# Patient Record
Sex: Female | Born: 1945
Health system: Southern US, Community
[De-identification: ages and names within clinical notes are randomized; demographics above are authoritative.]

## PROBLEM LIST (undated history)

## (undated) ENCOUNTER — Emergency Department (HOSPITAL_COMMUNITY): Admission: EM

## (undated) DIAGNOSIS — R911 Solitary pulmonary nodule: Secondary | ICD-10-CM

## (undated) DIAGNOSIS — K219 Gastro-esophageal reflux disease without esophagitis: Secondary | ICD-10-CM

## (undated) DIAGNOSIS — Z923 Personal history of irradiation: Secondary | ICD-10-CM

## (undated) DIAGNOSIS — C679 Malignant neoplasm of bladder, unspecified: Secondary | ICD-10-CM

## (undated) DIAGNOSIS — Z720 Tobacco use: Secondary | ICD-10-CM

## (undated) HISTORY — DX: Tobacco use: Z72.0

## (undated) HISTORY — DX: Malignant neoplasm of bladder, unspecified: C67.9

## (undated) HISTORY — DX: Solitary pulmonary nodule: R91.1

## (undated) HISTORY — DX: Gastro-esophageal reflux disease without esophagitis: K21.9

## (undated) HISTORY — PX: CYSTOSCOPY: SUR368

---

## 1997-08-13 ENCOUNTER — Ambulatory Visit (HOSPITAL_COMMUNITY): Admission: RE | Admit: 1997-08-13 | Discharge: 1997-08-13 | Payer: Self-pay | Admitting: Family Medicine

## 2001-05-24 ENCOUNTER — Inpatient Hospital Stay (HOSPITAL_COMMUNITY): Admission: EM | Admit: 2001-05-24 | Discharge: 2001-06-15 | Payer: Self-pay | Admitting: *Deleted

## 2001-05-24 ENCOUNTER — Encounter (INDEPENDENT_AMBULATORY_CARE_PROVIDER_SITE_OTHER): Payer: Self-pay | Admitting: *Deleted

## 2001-05-24 ENCOUNTER — Encounter: Payer: Self-pay | Admitting: *Deleted

## 2001-05-24 ENCOUNTER — Encounter (INDEPENDENT_AMBULATORY_CARE_PROVIDER_SITE_OTHER): Payer: Self-pay | Admitting: Specialist

## 2001-05-25 ENCOUNTER — Encounter: Payer: Self-pay | Admitting: Internal Medicine

## 2001-05-26 ENCOUNTER — Encounter: Payer: Self-pay | Admitting: Internal Medicine

## 2001-05-26 ENCOUNTER — Encounter: Payer: Self-pay | Admitting: Family Medicine

## 2001-05-27 ENCOUNTER — Encounter: Payer: Self-pay | Admitting: Internal Medicine

## 2001-05-27 ENCOUNTER — Encounter: Payer: Self-pay | Admitting: Surgery

## 2001-05-28 ENCOUNTER — Encounter: Payer: Self-pay | Admitting: Critical Care Medicine

## 2001-05-29 ENCOUNTER — Encounter: Payer: Self-pay | Admitting: Internal Medicine

## 2001-05-30 ENCOUNTER — Encounter: Payer: Self-pay | Admitting: Surgery

## 2001-05-30 ENCOUNTER — Encounter: Payer: Self-pay | Admitting: Internal Medicine

## 2001-06-01 ENCOUNTER — Encounter: Payer: Self-pay | Admitting: Pulmonary Disease

## 2001-06-07 ENCOUNTER — Encounter: Payer: Self-pay | Admitting: Internal Medicine

## 2001-06-11 ENCOUNTER — Encounter: Payer: Self-pay | Admitting: Internal Medicine

## 2001-06-20 ENCOUNTER — Encounter (HOSPITAL_COMMUNITY): Admission: RE | Admit: 2001-06-20 | Discharge: 2001-09-18 | Payer: Self-pay | Admitting: Dentistry

## 2001-07-31 ENCOUNTER — Encounter: Admission: RE | Admit: 2001-07-31 | Discharge: 2001-10-08 | Payer: Self-pay | Admitting: Surgery

## 2001-09-04 ENCOUNTER — Ambulatory Visit (HOSPITAL_COMMUNITY): Admission: RE | Admit: 2001-09-04 | Discharge: 2001-09-04 | Payer: Self-pay | Admitting: Family Medicine

## 2001-09-04 ENCOUNTER — Encounter: Payer: Self-pay | Admitting: Family Medicine

## 2001-09-26 ENCOUNTER — Encounter: Admission: RE | Admit: 2001-09-26 | Discharge: 2001-09-26 | Payer: Self-pay | Admitting: Infectious Diseases

## 2001-12-13 ENCOUNTER — Ambulatory Visit (HOSPITAL_COMMUNITY): Admission: RE | Admit: 2001-12-13 | Discharge: 2001-12-13 | Payer: Self-pay | Admitting: Family Medicine

## 2002-01-14 ENCOUNTER — Encounter: Admission: RE | Admit: 2002-01-14 | Discharge: 2002-01-14 | Payer: Self-pay | Admitting: Family Medicine

## 2002-01-14 ENCOUNTER — Encounter: Payer: Self-pay | Admitting: Family Medicine

## 2005-03-30 ENCOUNTER — Inpatient Hospital Stay (HOSPITAL_COMMUNITY): Admission: EM | Admit: 2005-03-30 | Discharge: 2005-04-07 | Payer: Self-pay | Admitting: Emergency Medicine

## 2005-04-22 ENCOUNTER — Ambulatory Visit: Payer: Self-pay | Admitting: *Deleted

## 2005-04-22 ENCOUNTER — Ambulatory Visit: Payer: Self-pay | Admitting: Family Medicine

## 2005-05-01 ENCOUNTER — Encounter (INDEPENDENT_AMBULATORY_CARE_PROVIDER_SITE_OTHER): Payer: Self-pay | Admitting: Family Medicine

## 2005-05-19 ENCOUNTER — Ambulatory Visit: Payer: Self-pay | Admitting: Family Medicine

## 2005-07-04 ENCOUNTER — Encounter: Admission: RE | Admit: 2005-07-04 | Discharge: 2005-07-04 | Payer: Self-pay | Admitting: Family Medicine

## 2005-11-16 ENCOUNTER — Ambulatory Visit: Payer: Self-pay | Admitting: Family Medicine

## 2006-07-20 ENCOUNTER — Ambulatory Visit (HOSPITAL_COMMUNITY): Admission: RE | Admit: 2006-07-20 | Discharge: 2006-07-20 | Payer: Self-pay | Admitting: Family Medicine

## 2006-10-13 ENCOUNTER — Encounter (INDEPENDENT_AMBULATORY_CARE_PROVIDER_SITE_OTHER): Payer: Self-pay | Admitting: Family Medicine

## 2006-11-27 ENCOUNTER — Ambulatory Visit: Payer: Self-pay | Admitting: Family Medicine

## 2006-12-19 DIAGNOSIS — J449 Chronic obstructive pulmonary disease, unspecified: Secondary | ICD-10-CM | POA: Insufficient documentation

## 2006-12-19 DIAGNOSIS — K5289 Other specified noninfective gastroenteritis and colitis: Secondary | ICD-10-CM | POA: Insufficient documentation

## 2006-12-19 DIAGNOSIS — J441 Chronic obstructive pulmonary disease with (acute) exacerbation: Secondary | ICD-10-CM | POA: Insufficient documentation

## 2006-12-19 DIAGNOSIS — F1021 Alcohol dependence, in remission: Secondary | ICD-10-CM

## 2006-12-19 DIAGNOSIS — N390 Urinary tract infection, site not specified: Secondary | ICD-10-CM | POA: Insufficient documentation

## 2006-12-19 DIAGNOSIS — D700 Congenital agranulocytosis: Secondary | ICD-10-CM

## 2007-08-01 ENCOUNTER — Ambulatory Visit (HOSPITAL_COMMUNITY): Admission: RE | Admit: 2007-08-01 | Discharge: 2007-08-01 | Payer: Self-pay | Admitting: Family Medicine

## 2007-09-12 ENCOUNTER — Ambulatory Visit: Payer: Self-pay | Admitting: Family Medicine

## 2007-09-12 DIAGNOSIS — R059 Cough, unspecified: Secondary | ICD-10-CM | POA: Insufficient documentation

## 2007-09-12 DIAGNOSIS — R05 Cough: Secondary | ICD-10-CM

## 2007-09-13 ENCOUNTER — Ambulatory Visit (HOSPITAL_COMMUNITY): Admission: RE | Admit: 2007-09-13 | Discharge: 2007-09-13 | Payer: Self-pay | Admitting: Family Medicine

## 2007-10-21 ENCOUNTER — Telehealth (INDEPENDENT_AMBULATORY_CARE_PROVIDER_SITE_OTHER): Payer: Self-pay | Admitting: *Deleted

## 2007-10-21 LAB — CONVERTED CEMR LAB
AST: 48 units/L — ABNORMAL HIGH (ref 0–37)
Albumin: 4 g/dL (ref 3.5–5.2)
Alkaline Phosphatase: 72 units/L (ref 39–117)
BUN: 5 mg/dL — ABNORMAL LOW (ref 6–23)
CO2: 22 meq/L (ref 19–32)
Calcium: 9 mg/dL (ref 8.4–10.5)
Chloride: 103 meq/L (ref 96–112)
Eosinophils Relative: 2 % (ref 0–5)
Glucose, Bld: 114 mg/dL — ABNORMAL HIGH (ref 70–99)
HCT: 42.4 % (ref 36.0–46.0)
LDL Cholesterol: 36 mg/dL (ref 0–99)
Monocytes Absolute: 0.3 10*3/uL (ref 0.1–1.0)
Neutro Abs: 0.6 10*3/uL — ABNORMAL LOW (ref 1.7–7.7)
Platelets: 109 10*3/uL — ABNORMAL LOW (ref 150–400)
Potassium: 3.7 meq/L (ref 3.5–5.3)
Sodium: 139 meq/L (ref 135–145)
TSH: 2.803 microintl units/mL (ref 0.350–4.50)
Total CHOL/HDL Ratio: 1.7
Triglycerides: 80 mg/dL (ref <150)

## 2007-11-13 ENCOUNTER — Ambulatory Visit: Payer: Self-pay | Admitting: Family Medicine

## 2007-11-13 ENCOUNTER — Encounter (INDEPENDENT_AMBULATORY_CARE_PROVIDER_SITE_OTHER): Payer: Self-pay | Admitting: Family Medicine

## 2007-11-13 DIAGNOSIS — R310 Gross hematuria: Secondary | ICD-10-CM | POA: Insufficient documentation

## 2007-11-13 LAB — CONVERTED CEMR LAB
Glucose, Urine, Semiquant: NEGATIVE
WBC Urine, dipstick: NEGATIVE

## 2007-11-14 ENCOUNTER — Encounter (INDEPENDENT_AMBULATORY_CARE_PROVIDER_SITE_OTHER): Payer: Self-pay | Admitting: Family Medicine

## 2007-11-15 LAB — CONVERTED CEMR LAB
Basophils Absolute: 0 10*3/uL (ref 0.0–0.1)
Basophils Relative: 1 % (ref 0–1)
Eosinophils Absolute: 0.1 10*3/uL (ref 0.0–0.7)
Eosinophils Relative: 4 % (ref 0–5)
Folate: 18 ng/mL
HCT: 41.4 % (ref 36.0–46.0)
Hemoglobin: 13.3 g/dL (ref 12.0–15.0)
Hgb A1c MFr Bld: 5.2 % (ref 4.6–6.1)
Lymphocytes Relative: 68 % — ABNORMAL HIGH (ref 12–46)
Lymphs Abs: 1.9 10*3/uL (ref 0.7–4.0)
MCHC: 32.1 g/dL (ref 30.0–36.0)
MCV: 100.7 fL — ABNORMAL HIGH (ref 78.0–100.0)
Monocytes Absolute: 0.2 10*3/uL (ref 0.1–1.0)
Monocytes Relative: 9 % (ref 3–12)
Neutro Abs: 0.5 10*3/uL — ABNORMAL LOW (ref 1.7–7.7)
Neutrophils Relative %: 18 % — ABNORMAL LOW (ref 43–77)
Platelets: 144 10*3/uL — ABNORMAL LOW (ref 150–400)
RBC: 4.11 M/uL (ref 3.87–5.11)
RDW: 13.1 % (ref 11.5–15.5)
Vitamin B-12: 483 pg/mL (ref 211–911)
WBC: 2.8 10*3/uL — ABNORMAL LOW (ref 4.0–10.5)

## 2007-12-03 ENCOUNTER — Ambulatory Visit: Payer: Self-pay | Admitting: Family Medicine

## 2007-12-03 LAB — CONVERTED CEMR LAB
Bilirubin Urine: NEGATIVE
Ketones, urine, test strip: NEGATIVE
Specific Gravity, Urine: 1.015
pH: 5

## 2007-12-11 ENCOUNTER — Ambulatory Visit (HOSPITAL_COMMUNITY): Admission: RE | Admit: 2007-12-11 | Discharge: 2007-12-11 | Payer: Self-pay | Admitting: Family Medicine

## 2007-12-11 ENCOUNTER — Telehealth (INDEPENDENT_AMBULATORY_CARE_PROVIDER_SITE_OTHER): Payer: Self-pay | Admitting: Family Medicine

## 2007-12-11 DIAGNOSIS — C679 Malignant neoplasm of bladder, unspecified: Secondary | ICD-10-CM | POA: Insufficient documentation

## 2008-01-30 ENCOUNTER — Encounter (INDEPENDENT_AMBULATORY_CARE_PROVIDER_SITE_OTHER): Payer: Self-pay | Admitting: Family Medicine

## 2008-03-12 ENCOUNTER — Encounter (INDEPENDENT_AMBULATORY_CARE_PROVIDER_SITE_OTHER): Payer: Self-pay | Admitting: Family Medicine

## 2008-07-21 ENCOUNTER — Encounter (INDEPENDENT_AMBULATORY_CARE_PROVIDER_SITE_OTHER): Payer: Self-pay | Admitting: Family Medicine

## 2008-08-01 ENCOUNTER — Ambulatory Visit (HOSPITAL_COMMUNITY): Admission: RE | Admit: 2008-08-01 | Discharge: 2008-08-01 | Payer: Self-pay | Admitting: Family Medicine

## 2009-04-27 IMAGING — US US RENAL
1 series · 13 of 25 positions shown · non-contrast
Comparison: Renal ultrasound 04/02/2005

CLINICAL DATA: Patient with painless gross hematuria.

RENAL/URINARY TRACT ULTRASOUND
TECHNIQUE: Complete ultrasound examination of the urinary tract
was performed including evaluation of the kidneys, renal collecting
systems, and urinary bladder.

[Series 1: unknown · 0.30mm/px · 13 of 46 slices shown]
[im 1/46]
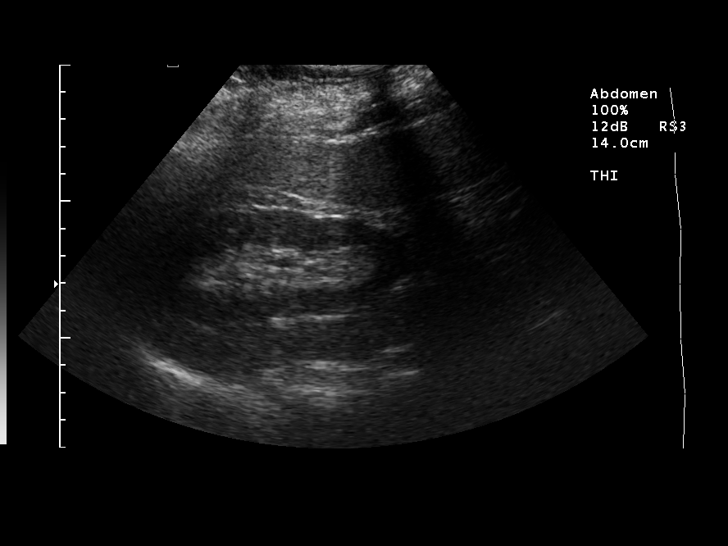
[im 4/46]
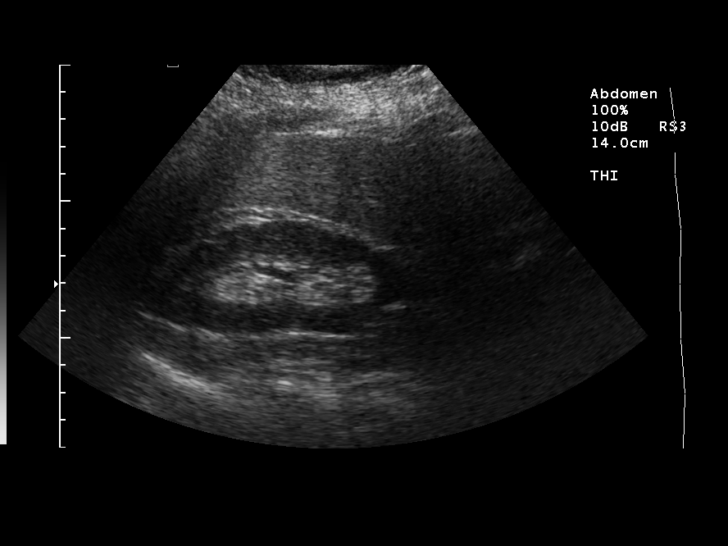
[im 8/46]
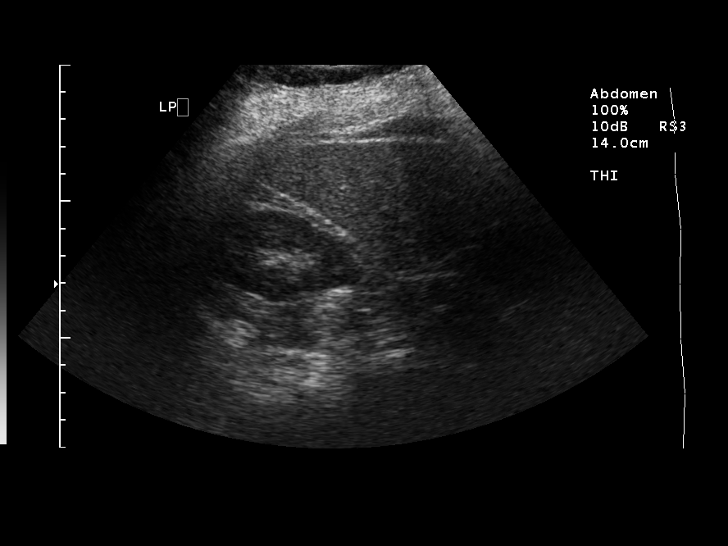
[im 12/46]
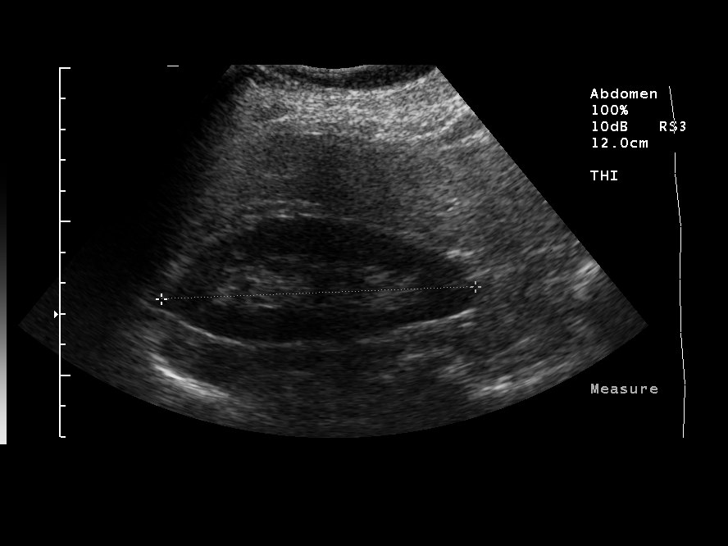
[im 16/46]
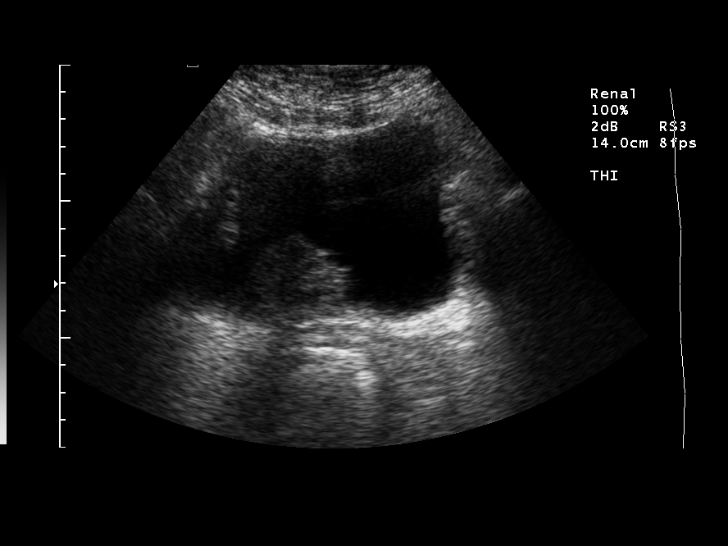
[im 19/46]
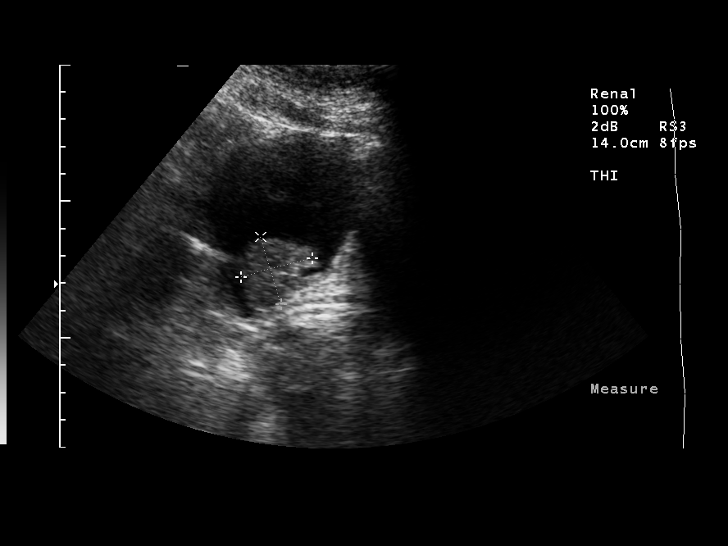
[im 23/46]
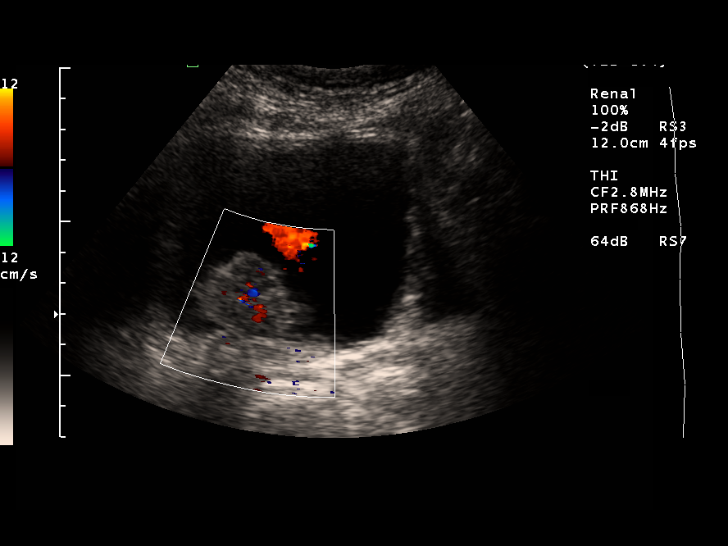
[im 27/46]
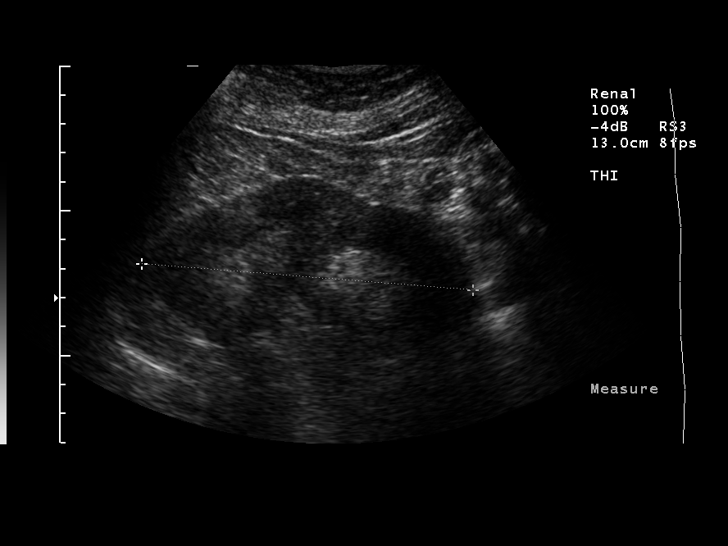
[im 31/46]
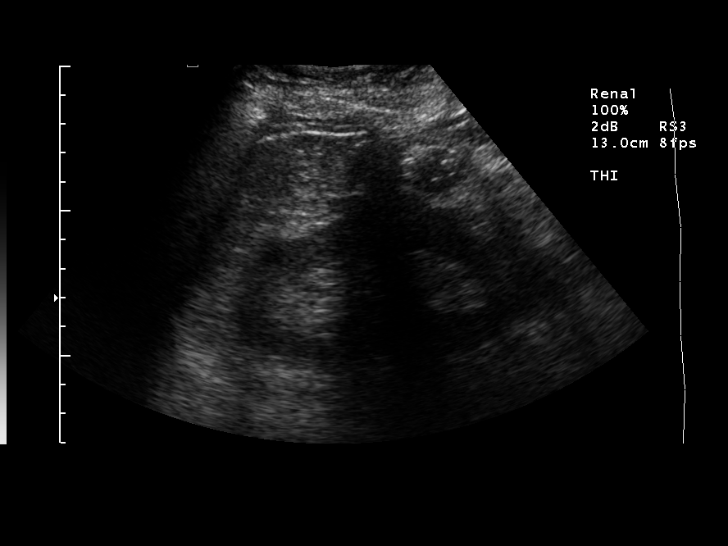
[im 34/46]
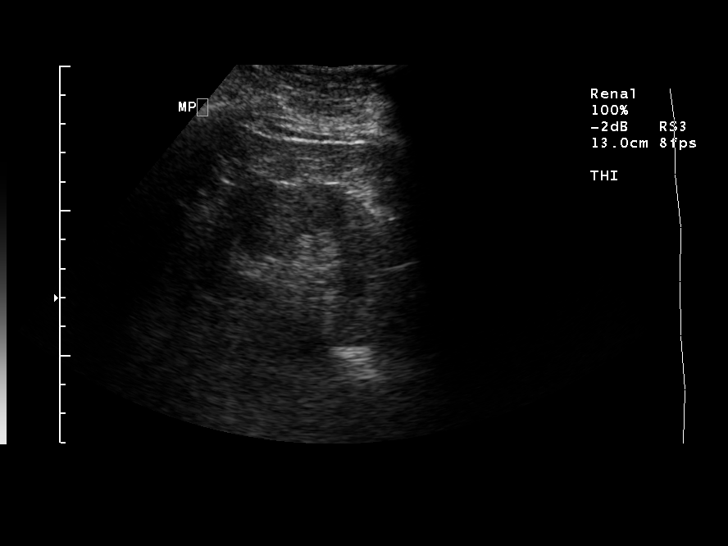
[im 38/46]
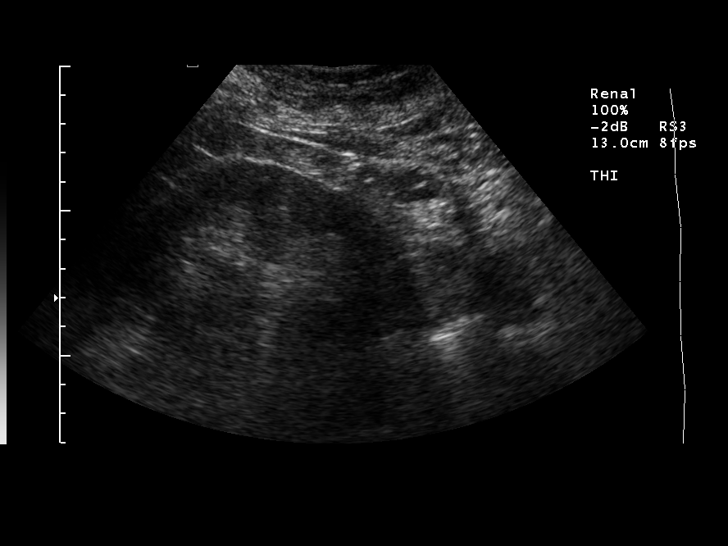
[im 42/46]
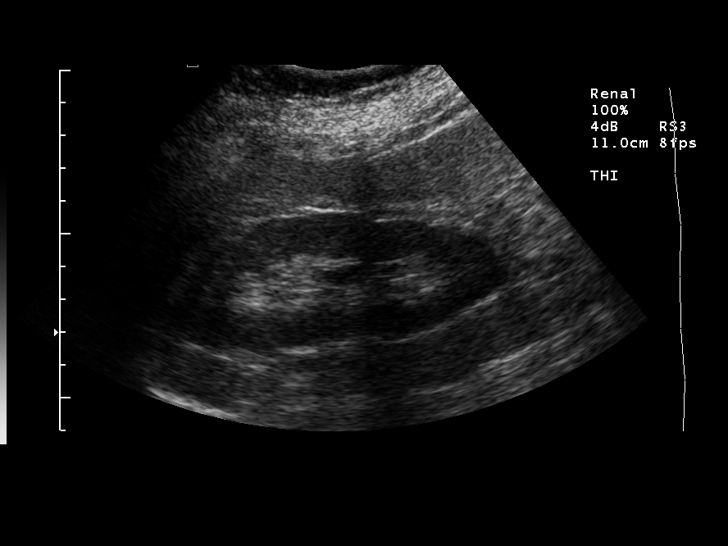
[im 46/46]
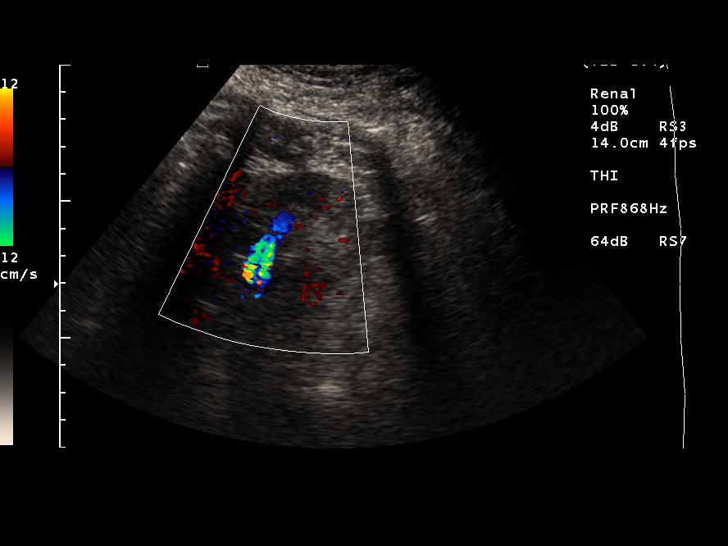

[13 of 25 positions shown; findings below may reference images not displayed]

FINDINGS: The kidneys are normal in size, measuring 11.2 cm in
length on the right and 11.5 cm in length on the left.
Echogenicity of the right kidney is decreased relative to the
adjacent liver, raising the question of fatty infiltration of the
liver.  Renal parenchyma appears preserved bilaterally.  There is
no hydronephrosis.

The urinary bladder is moderately distended with urine.  There is a
polypoid echogenic and slightly heterogeneous mass associated with
the posterior urinary bladder wall, at the level of midline and
extending to the right of midline.  This mass measures 2.7 x 2.6 x
4.1 cm.  Color Doppler imaging evaluation demonstrates vascular
flow within the mass.  There is no evidence of ureteral dilatation
at the bladder base.
IMPRESSION: 1. Polypoid soft tissue mass associated with the posterior urinary
bladder wall contains internal vascular flow and is suspicious for
transitional cell carcinoma.  Blood clot can have a similar
appearance, but we would not expect to see internal vascular flow
within clot.  Further evaluation with cystoscopy is suggested.
Findings were discussed with Dr. Rithvik by telephone on 12/11/2007
at [DATE].

2. Question fatty infiltration of the liver.

## 2009-08-05 ENCOUNTER — Ambulatory Visit (HOSPITAL_COMMUNITY): Admission: RE | Admit: 2009-08-05 | Discharge: 2009-08-05 | Payer: Self-pay | Admitting: Internal Medicine

## 2009-08-11 ENCOUNTER — Telehealth (INDEPENDENT_AMBULATORY_CARE_PROVIDER_SITE_OTHER): Payer: Self-pay | Admitting: Internal Medicine

## 2010-02-21 ENCOUNTER — Encounter: Payer: Self-pay | Admitting: Occupational Therapy

## 2010-03-04 NOTE — Progress Notes (Signed)
Summary: SPOKE W/MS [place]  Phone Note Outgoing Call   Summary of Call: This pt. has not been seen in about 1 1/2 years--would be good for her to establish with a provider now present. Initial call taken by: Julieanne Manson MD,  August 11, 2009 9:56 AM  Follow-up for Phone Call        TALK W/MS [place] AND SHE HAS BEEN GOING TO [place] FOR 2 YRS FOR HER CANCER AND SHE SAYS THAT SHE ISN'T TAKING ANY MEDS AND THE DR'S THERE HAVE BEEN CHECKING HER OUT WITH HER BP, AND SHE SAYS THAT IF SHE NEEDS TO MAKE AN APPOINTMENT IN THE FUTURE SHE WILL GIVE [place] A CALL. Follow-up by: Leodis Rains,  August 13, 2009 10:05 AM

## 2010-06-18 NOTE — Op Note (Signed)
[place]. [facility]  Patient:    [name], [name] Visit Number: [id number] MRN: [id number]          Service Type: MED Location: [phone] Attending Physician:  Edwyna Perfect Dictated by:   Sandria Bales. Ezzard Standing, M.D. Proc. Date: 06/04/01 Admit Date:  05/24/2001 Discharge Date: 05/25/2001   CC:         Dr. Alfonse Alpers, Anesthesia Svc  C. Ulyess Mort, M.D.   Operative Report  PREOPERATIVE DIAGNOSIS:  Right calf abscess.  POSTOPERATIVE DIAGNOSIS:  Right calf abscess.  OPERATION PERFORMED:  Incision and drainage of right calf abscess with packing of 2-inch iodoform gauze.  SURGEON:  Sandria Bales. Ezzard Standing, M.D.  ANESTHESIA:  General endotracheal.  ESTIMATED BLOOD LOSS:  100 cc.  DRAINS:  None.  INDICATIONS FOR PROCEDURE:  Ms. [name] is a 65 year old black female who was admitted to the hospital on May 24, 2001 for questionable pneumonia but she developed this kind of desquamating skin condition of her lower extremities, buttocks and a CT scan done yesterday questions a 7 x 4 x 3 cm abscess in the right calf.  There was nothing on the actual physical exam to indicate an abscess other than some induration.  She does have this desquamation which involves some skin of her lower calf but this actually appears to be healing. She is spiking temps to 105 and this is questionable the source of infection. Plan to bring the patient to the operating room for exploration.  DESCRIPTION OF PROCEDURE:  The patient placed in the operating room in the left lateral decubitus position and using an ultrasound probe 7.5 mhz ultrasound probe, I thought I could see a collection about midcalf.  I put a needle in this area and got some fluid out that I sent for culture and sensitivity.  I then made an incision which was about 3 to 4 cm in length over this area, got into a subfascial collection which I cleared out, irrigated and it looked like it had some kind of thin purulent  material but no real discrete walled abscess like what appeared to be on the CT scan.  I then packed it with 2-inch iodoform gauze, wrapped the leg with 4 x 4s and Kling.  She was transferred to recovery room in good condition.  Will resume her preoperative medicines and antibiotics and will follow this wound. Dictated by:   Sandria Bales. Ezzard Standing, M.D. Attending Physician:  Edwyna Perfect DD:  06/04/01 TD:  06/06/01 Job: [id number]

## 2010-06-18 NOTE — Op Note (Signed)
[place]. [facility]  Patient:    [name], [name] Visit Number: [id number] MRN: [id number]          Service Type: MED Location: [phone] Attending Physician:  Edwyna Perfect Dictated by:   Cindra Eves, D.D.S. Proc. Date: 06/14/01 Admit Date:  05/24/2001 Discharge Date: 06/15/2001   CC:         Lennis P. Darrold Span, M.D.  Gary Fleet, M.D.  Dante Gang, M.D.  Dewayne Shorter, M.D.   Operative Report  DATE OF BIRTH:  12/22/1945  PREOPERATIVE DIAGNOSES: 1. Group A Streptococcal septicemia. 2. Chronic obstructive pulmonary disease. 3. History of pneumonia. 4. Chronic periodontitis. 5. Apical periodontitis. 6. Dental caries. 7. Multiple root segments. 8. Bilateral maxillary tuberosities which hung down into the ideal occlusal    scheme.  POSTOPERATIVE DIAGNOSES: 1. Group A Streptococcal septicemia. 2. Chronic obstructive pulmonary disease. 3. History of pneumonia. 4. Chronic periodontitis. 5. Apical periodontitis. 6. Dental caries. 7. Multiple root segments. 8. Bilateral maxillary tuberosities which hung down into the ideal occlusal    scheme.  PROCEDURES: 1. Dental examination. 2. Extraction of remaining teeth #21, #22, #23, #24, #25, #25, and #27. 3. Two quadrants of alveoloplasty. 4. Bilateral maxillary tuberosity reductions of the upper right and upper left    quadrants.  SURGEON:  Cindra Eves, D.D.S.  ASSISTANT:  Elliot Dally (Sales executive).  ANESTHESIA:  General anesthesia via nasal endotracheal tube.  MEDICATIONS: 1. Clindamycin 600 mg IV at the start of the dental medicine procedure. 2. Local anesthesia with a total utilization of four carpules each containing    36 mg of Xylocaine with 0.018 mg of epinephrine.  SPECIMENS:  There were seven teeth which were discarded.  DRAINS/CULTURES:  None.  ESTIMATED BLOOD LOSS:  Less than 50 ml.  FLUIDS:  800 ml of lactated Ringers  solution.  COMPLICATIONS:  None.  INDICATIONS FOR PROCEDURE:  The patient had a history of pneumonia, group A Streptococcal septicemia, and COPD.  The patient was referred for evaluation to rule out dental infection which could effect the patients systemic health.  The patient was then evaluated and treatment planned for extraction of her remaining teeth with alveoloplasty as well as reduction of the maxillary tuberosities which were hanging down into the ideal occlusal scheme.  This treatment plan was formulated to decrease the risks and complications associated with dental infection effecting the patients systemic health.  OPERATIVE FINDINGS:  The patient was examined in operating room #3.  The patients teeth were identified for extraction and the maxillary tuberosities were identified for reduction.  The patient was noted to be effected by chronic periodontal disease, chronic apical periodontitis, presence of multiple root segments, and significant tooth mobility.  The preceding necessitated the removal of the remaining dentition to reduce the infection of dental disease effecting the patients systemic health.  The maxillary tuberosities were reduced to assist in the fabrication of future dental prosthesis (complete denture).  DESCRIPTION OF PROCEDURE:  The patient was brought to operating room #3.  The patient was then placed in the supine position on the operating room table. The patient was then intubated utilizing a nasal endotracheal tube.  The patient was then prepped and draped in the usual sterile manner for an oral surgical procedure.  The oral cavity was then thoroughly examined with the findings as noted above.  A throat pack was placed at this time.  The patient was then ready for the oral surgical procedure as follows:  Local anesthesia was administered with the total utilization of four carpules each containing 36 mg of Xylocaine with 0.018 mg of epinephrine during  the dental medicine procedure.  The anesthesia was administered sequentially over this procedure.  The mandibular quadrants were first approached.  Anesthesia was delivered as previously described.  A 15 blade incision was made from the distal of #19 through the mesial of #30.  A surgical flap was then reflected.  The remaining teeth and root segments were subluxated with a series of straight elevators.  The remaining teeth and root segments were then removed with a 151 forceps without complications.  This included teeth #21, #22, #23, #24, #25, #26, and #27.  Significant alveoloplasty was then performed utilizing rongeurs and bone file.  The surgical site was then irrigated with copious amounts of sterile saline.  The tissues were then approximated and trimmed appropriately.  The surgical site was then closed from the distal of #19 through the mesial of #24 utilizing 3-0 chromic gut suture material in a continuous interrupted suture technique x1.  The surgical site on the lower right was then closed utilizing 3-0 chromic gut suture material from the mesial of #30 through the mesial of #25 utilizing a continuous interrupted technique x1.  One additional interrupted suture was utilized to close the lower right quadrant.  The maxillary quadrants were then approached.  Anesthesia was delivered as previously described.  The maxillary left quadrant was then first approached. A 15 blade incision was made from the distal of the tuberosity through the mesial of #11.  The tuberosity reduction was achieved utilizing a 15 blade incision and the removal of the significant hyperplastic tissues.  The tissues were then approximated and trimmed appropriately.  The surgical site was then  irrigated with copious amounts of sterile saline.  The surgical site was then closed utilizing 3-0 chromic gut suture material in a continuous interrupted suture technique from the distal of the tuberosity through the  mesial of #11.  The maxillary right quadrant was then approached.  Anesthesia had been previously delivered.  A 15 blade incision was made from the distal tuberosity through the mesial of #6.  Significant amounts of a hyperplastic tissue were then removed utilizing the 15 blade and soft tissue pickups.  Further tissue recontouring was achieved utilizing soft tissue scissors.  The tissues were then approximated and trimmed appropriately.  The surgical site was then irrigated with copious amounts of sterile saline.  The surgical site was then closed utilizing 3-0 chromic gut suture material in a continuous interrupted suture technique from the distal of the tuberosity through the mesial of #6.  The entire mouth was then irrigated with copious amounts of sterile saline. The patient was examined for complications, seeing none, the dental medicine procedure was deemed to be complete.  The throat pack was removed at this time.  Two sets of 4 x 4 gauzes were placed in the mouth upon direction of the anesthesia team to assist in hemostasis.  The patient was then handed over to the anesthesia team for final disposition. After an appropriate amount of time, the patient was extubated, and taken to the postanesthesia care unit with stable vital signs and oxygenation level. All counts were correct for this dental medicine procedure. Dictated by:   Cindra Eves, D.D.S. Attending Physician:  Edwyna Perfect DD:  06/14/01 TD:  06/18/01 Job: [id number]

## 2010-06-18 NOTE — Discharge Summary (Signed)
[name], [name]                           ACCOUNT NO.:  [id number]   MEDICAL RECORD NO.:  [id number]                  PATIENT TYPE:   LOCATION:                                       FACILITY:   PHYSICIAN:  Dante Gang, M.D.               DATE OF BIRTH:  04-20-1945   DATE OF ADMISSION:  05/24/2001  DATE OF DISCHARGE:  06/15/2001                                 DISCHARGE SUMMARY   PROBLEM LIST:  1. Cavitary pneumonia.  2. History of neutropenia.  3. Group A Strep bacteremia and myositis.  4. History of alcohol abuse.  5. History of hysterectomy in 1981 at [facility] for tumor removal.   DISCHARGE MEDICATION:  Vicodin 5/500 one p.o. q.4h. p.r.n. pain.   HISTORY OF PRESENT ILLNESS:  The patient was a 65 year old woman who  presented to the emergency department with a five-day history of nausea,  vomiting and diarrhea, ear ache, headache, and cough productive of blood  tinged sputum.  She had also had fatigue, fever, chills and myalgias.  Up  until then she was in a state of good health and had very few previous  medical problems.  Her appetite was decreasing and she was unable to keep  anything down. She reported multiple sick contacts at work, many of which  were among recent immigrants.  She believed herself to be HIV negative.   ADMISSION PHYSICAL EXAMINATION:  Temperature 102.9, respiratory rate 24,  heart rate 106, blood pressure 92/63.  Oxygen saturation 99% on room air.  Her pupils were constricted.  She had a scab on her left foot, otherwise her  examination was entirely within normal limits at that time.   ADMISSION LABORATORY DATA:  White blood cell count 1.5, hemoglobin 13.2,  platelets 132.  Blood gas with pH of 7.51, pCO2 31, bicarb 25, breathing 96%  on room air.   Chest x-ray showed a cavitary lesion in the right upper lobe.   HOSPITAL COURSE:  PROBLEM #1 - CAVITARY LESION ON CHEST X-RAY:  This was  thought to be a possible necrotizing infection  due to its rapid onset or  possibly Klebsiella or an anaerobic species.  She was admitted to the  hospital and started on Zosyn and Tequin.  She was monitored in respiratory  isolation and PPD was checked.   Infectious disease was consulted and recommended stopping the Tequin and  continuing the Zosyn.   Around May 26, 2001, the patient began to develop severe sepsis with  positive blood cultures for Strep pyogenes.  Critical care medicine was  consulted and she was monitored in intensive care.   She also developed bilateral lower extremity edematous processes suggesting  underlying rhabdo and fasciitis.  Surgery was consulted on May 27, 2001,  and they did not see a role at that time for fasciotomy.  After review of  MRI of the left leg, a bullous lesion was  found by surgery and I&D was  performed and left open for drainage.  She was placed on Primaxin due to the  group A bacteremia.  PPD was negative.   Her blood pressure began to stabilize and her antibiotics were switched to  Rocephin and clindamycin on  May 29, 2001.   As of May 1, she had had three negative AFB smears and was transferred to a  regular bed.  Bronchoscopy was performed on Jun 01, 2001.  On review with the  pathologist of the cultures, it appeared that the cavitary lesion was likely  due to group A strep.  She developed a drug fever while on the Rocephin and  clindamycin and was taken off this and placed on Tequin after which the  fever and rash resolved.   The patient's fever began to trend down rapidly and the chest x-ray showed  her cavitary lung lesion to be improving considerably.  She began improving  clinically and was still having occasional temperatures to 105 to 106 but on  the whole, her condition was thought to be improved well enough for  discharge.  Prior to discharge, she was taken off all antibiotics and the  plan was to observe her off these antibiotics for a few weeks.  She was to  follow  up with Dr. Alfonse Alpers in the [facility] and afterwards, to return  to [facility]   PROBLEM #2 - NEUTROPENIA:  The patient was seen in the hospital by Dr.  Darrold Span.  She felt that a bone marrow biopsy was unnecessary because the  neutropenia was almost certainly secondary to long-term alcohol abuse.  She  was placed on Neupogen during her hospitalization and her white blood cell  count rose as high as 7.0 before discharge.   PROBLEM #3 - POOR DENTITION:  The patient was seen by Dr. Kristin Bruins in house  who extracted her remaining teeth.  She was to follow up with him one week  after discharge.                                               Dante Gang, M.D.    JP/MEDQ  D:  12/18/2001  T:  12/18/2001  Job:  [id number]

## 2010-06-18 NOTE — Consult Note (Signed)
[place]. [facility]  Patient:    [name], [name] Visit Number: [id number] MRN: [id number]          Service Type: MED Location: MICU [id number] Attending Physician:  Farley Ly Dictated by:   Lorette Ang, N.P. Proc. Date: 05/25/01 Admit Date:  05/24/2001 Discharge Date: 05/25/2001   CC:         Farley Ly, M.D.   Consultation Report  DATE OF BIRTH:  [birth year]  REASON FOR CONSULTATION:  Neutropenia.  REFERRING PHYSICIAN:  Farley Ly, M.D.  HISTORY OF PRESENT ILLNESS:  Mr. [name] is a 65 year old woman with a history of neutropenia documented back to 1999 at which time her ANC was 0.8 who was admitted on May 24, 2001 with a febrile illness and was found to have a cavitary lesion within the right upper lobe.  Follow-up chest CT confirmed a 5 cm complex cystic lesion in the right upper lobe suspicious for TB and/or fungal disease.  Admission laboratories showed hemoglobin 13.2, MCV 87, white blood cell count 1.5, ANC 0.9, ALC 0.3, platelet count 132,000, sodium 130, potassium 2.9, creatinine 1.0, total bilirubin 0.8, alkaline phosphatase 33, SGOT 87, SGPT 29, total protein 5.9, albumin 2.1, calcium 7.1.  Other laboratories include B12 883, ferritin 1560, iron less than 10, folate RBC 288, normal TSH.  Four blood cultures all with gram-positive cocci.  A review of prior laboratory work shows hemoglobin 13.3, MCV 96, white blood cell count 2.8, ANC 0.8, platelet count 197,000 July 1999.  Hemoglobin 14, MCV 95, white blood cell count 3.1, ANC 0.5, platelet count 216,000 September 1999.  Hematology consult was requested for further evaluation of this patient with a history of neutropenia.  PAST MEDICAL HISTORY: 1. Questionable history of COPD. 2. History of neutropenia July 1999. 3. Status post hysterectomy secondary to fibroid tumor 1981.  MEDICATIONS: 1. Folic acid 1 mg daily. 2. Tequin 400 mg daily. 3. Zosyn  3.375 g q.6h. 4. Thiamine 100 mg daily. 5. Vancomycin q.24h.  ALLERGIES:  No known drug allergies.  FAMILY HISTORY:  Mother is living and reported to be healthy.  Father deceased with throat cancer.  Two sisters and one brother reported to be healthy. Paternal uncle deceased with "cancer."  Patient also reports possible leukemia in niece and nephew of her mother.  SOCIAL HISTORY:  Ms. [name] lives in [place] with her daughter.  She is single.  She is currently employed at [employer].  She reports tobacco use at one and a half packs per day for approximately 44 years up until 10 days prior to admission.  She reports ETOH intake at one to two quarts of beer per day.  She has used marijuana in the past.  She denies any IV drug use.  She has never had a blood transfusion.  REVIEW OF SYSTEMS:  Patient denies any weight loss or anorexia.  She has been having fevers.  She reports increasing weakness.  She states her muscles have been achy.  She has also been experiencing headaches.  She denies any vision changes.  She does report some right ear pain.  She denies any mouth sores. She has not noticed any enlarged lymph nodes.  She has been experiencing dyspnea on exertion.  She has had a cough with hemoptysis at times over the past five days.  She denies any chest pain or peripheral edema.  She has been having some diarrhea.  She has had no bright red  blood per rectum.  She does report possible melena prior to admission.  She denies any hematuria or dysuria.  She reports pain with sexual intercourse.  PHYSICAL EXAMINATION  VITAL SIGNS:  Temperature 100.7, heart rate 116, respirations 34, blood pressure 92/46, oxygen saturation 94% on 3 L.  GENERAL:  Pleasant African-American female lying in bed in no distress at present.  HEENT:  Normocephalic, atraumatic.  Pupils are equal, round, and reactive to light.  Extraocular movements are intact.  Sclerae anicteric.  Tongue is  dry. There is a white patch on the left posterior palate.  Poor dentition.  LYMPH:  No palpable cervical, supraclavicular, or axillary lymph nodes.  CHEST:  Scattered rhonchi.  CARDIOVASCULAR:  Regular rate and rhythm.  ABDOMEN:  Soft and nontender.  Bowel sounds are hypoactive.  EXTREMITIES:  No clubbing, cyanosis, edema.  She is diffusely tender over her lower extremities without clear cords.  There is a linear abrasion on her left shin.  PPD noted on right forearm, but no controls.  GENITOURINARY:  Foley with pinkish urine.  NEUROLOGIC:  Alert and oriented.  Speech somewhat difficult to understand.  SKIN:  Warm.  LABORATORY DATA:  Hemoglobin 12.2, white count 0.8, platelets 97,000.  Sodium 129, potassium 3.6, BUN 12, creatinine 1.2, glucose 118, calcium 7.2, folate RBC 288, B12 883, iron less than 10, ferritin 1560.  Prior laboratories July 1999:  Hemoglobin 13.3, white count 2.8, ANC 0.8, platelets 197,000. Laboratories September 1999:  Hemoglobin 14, white count 3.1, platelets 216,000, ANC 0.5.  Review of peripheral blood smear:  rbcs negative, platelets slightly decreased, no immature cells seen.  Urinalysis remarkable for large amount of blood with 3-10 rbcs per high powered field.  IMPRESSION/RECOMMENDATIONS: 1. Cavitary lung mass in smoker with gram-positive cocci in multiple blood    cultures.  Infectious disease work-up is in progress.  She may still need    bronchoscopy depending on other evaluation and results. 2. Neutropenia documented back to 1999.  Question if this is a benign    leukopenia in an African-American female versus marrow suppression from    chronic ETOH "a quart a day since I was 33" versus secondary to infection    versus other bone marrow etiology.  We doubt any primary marrow abnormality    such as leukemia with counts from 1999.  Suggest empirically beginning    Nupogen with apparent extensive infection with multiple positive blood     cultures.  3. Thrombocytopenia, mild.  Question if this is secondary to ETOH versus    infection.  There is no evidence of DIC by peripheral smear.  Only bleeding    is questionable hematuria.  We will continue to follow with you.  Patient seen and examined by Dr. Darrold Span.  Laboratories, peripheral blood smear, and CT scans reviewed. Dictated by:   Lorette Ang, N.P. Attending Physician:  Farley Ly DD:  05/29/01 TD:  05/30/01 Job: [id number]

## 2010-06-18 NOTE — Discharge Summary (Signed)
[name], [name]               ACCOUNT NO.:  [id number]   MEDICAL RECORD NO.:  [id number]          PATIENT TYPE:  INP   LOCATION:  5743                         FACILITY:  [facility]   PHYSICIAN:  Isidor Holts, M.D.  DATE OF BIRTH:  October 15, 1945   DATE OF ADMISSION:  03/30/2005  DATE OF DISCHARGE:  04/07/2005                                 DISCHARGE SUMMARY   DISCHARGE DIAGNOSES:  1.  Escherichia coli urinary tract infection/Urosepsis.  2.  Bicytopenia.  3.  Oral thrush.  4.  History of alcohol abuse.  5.  Dehydration/Electrolyte abnormalities.   DISCHARGE MEDICATIONS:  1.  Multivitamins one p.o. daily.  2.  Foltx one p.o. daily.   OPERATION/PROCEDURE:  1.  Chest x-ray dated March 30, 2005 which showed bronchial thickening      and hyperinflation, query chronic bronchitis, also residual scarring in      the right upper lobe.  No acute consolidation or definite pneumonia.  2.  Chest x-ray dated April 02, 2005. This showed stable test.  No acute      findings.  3.  Renal ultrasound scan dated April 02, 2005.  This showed no evidence of      hydronephrosis of focal renal mass.  4.  Portable chest x-ray dated April 03, 2005.  This showed no acute      cardiopulmonary disease.   CONSULTATIONS:  None.   ADMISSION HISTORY:  As in H&P notes of March 30, 2005 dictated by Dr.  Jamison Oka.  However, in brief, this is a 65 year old female, with past  medical history significant for hospitalization in 2003 for necrotizing  pneumonia and strep bacteremia, prior history of alcohol abuse, smoking  history.  Also neutropenia noted during hospitalization in 2003.  Presenting  on this occasion, with a five-day history of fever and chills.  There was no  history of dysuria, urgency, or urinary frequency.  She was admitted for  evaluation, investigation and management.   HOSPITAL COURSE:  1.  E. coli UTI/Urosepsis.  The patient presents with a temperature of      104.1, blood  pressure in the low 90s systolic, white cell count of 1.8      with a absolute neutrophil count of 32.  Although she had no urinary      symptoms at the time of presentation, urinalysis showed wbc's 10 to 15      and many bacteria.  She was, therefore, started with broad-spectrum      antibiotic coverage with Zosyn and ciprofloxacin. Septic work-up was      done, including blood cultures, chest x-ray, urinalysis.  Chest x-ray      was negative for acute disease.  Urine cultures subsequently      demonstrated E. coli, which was pansensitive, although it was resistant      to cephazolin.  Blood cultures remained persistently negative.  On April 02, 2005, the patient had a persistent low-grade fever of 100.8.  Zosyn      was, therefore, discontinued and the patient switched to a combination  of vancomycin and Primaxin.  Threafter, clinical response was steady.      As of April 04, 2005, the patient was no longer pyrexial, was looking      quite well, able to maintain oral intake without any problems      whatsoever, and blood cultures were again negative.  Vancomycin was      discontinued.  The patient was continued on Primaxin which was      eventually discontinued on April 05, 2005.  The patient has remained      afebrile.  Repeat urinalysis of April 05, 2005 showed wbc's 0-2, bacteria      rare, rbc's 21-50,  but the patient had a Foley catheter in situ.  It is      noted that renal ultrasound scan dated April 05, 2005 showed no evidence      of hydronephrosis or focal renal mass.   1.  Neutropenia.  This is deemed likely secondary to sepsis.  Review of      electronic medical records, indicated that the patient was found to be      neutropenic during last admission in February 2003.  At that time she      was briefly under the care of Dr. Darrold Span, hematologist.  Conclusion at      that time, was that her neutropenia was secondary to sepsis and alcohol      abuse.  She presents on this  occasion with a leukocyte count of 1.38 and      absolute neutrophils count of 32.  Neutropenic precautions were      instituted.  Platelet count was 118.  The HIV test was negative.  The      patient's white cell count and platelet count subsequently improved      during the course of her hospital stay.  We are pleased to announce that      on April 06, 2005 her white blood cell count was 2.6 with a platelet      count of 408.  Hemoglobin was 12.  The patient thus has a isolated      leukopenia.  I had a curb side discussion by telephone, with Dr.      Darrold Span, who contends that this patient has been seen by her so far back      in the past, that she will, for  follow up purposes, require referral to      the hematology clinic as a new referral for evaluation.  We shall defer      this to the patient's PMD.   1.  Oral thrush.  At the time of physical evaluation on admission, the      patient was found to have oral thrush.  She was treated with a seven-day      course of Diflucan and complete solution.   1.  History of alcohol abuse.  The patient carries a background history of      prior alcohol abuse.  However, on further questioning, it appears that      the patient drinks one beer per day at present. During the course of her      hospital stay, she was managed with vitamin supplementation.  There was      no evidence of alcohol withdrawal phenomena.   1.  Smoking.  The patient has been counseled appropriately for this.   1.  Dehydration/Electrolyte abnormalities.  At the time of initial  presentation, the patient had a hyponatremia, with sodium of 128.      Potassium was also reduced at 3.  BUN 5, creatinine 0.9.  Given the      patient's borderline low systolic blood pressure, it was felt that she      volume depleted.  She was managed with intravenous fluid hydration, with     recovery of sodium levels.  As a matter of fact serum electrolytes on      April 06, 2005 showed a  sodium of 136, potassium 4.3.  She did have a      transient episode of loose stools during the course of her hospital      stay, however, this was negative for C. diff toxin.  She was managed      briefly with probiotics, with complete resolution of symptoms.   DISPOSITION:  It is anticipated that the patient is stable enough for  discharge on April 07, 2005, provided no acute issues arise.  She may return  to regular activities on April 11, 2005.   DIET:  No restrictions.   ACTIVITY:  As tolerated.   WOUND CARE:  Not applicable.   PAIN MANAGEMENT:  Not applicable.   FOLLOW UP:  The patient is to follow up with her primary M.D., Dr. Luciana Axe,  [facility] in one to two weeks.  She has been instructed to call for an  appointment.  We expect her PMD, Dr. Luciana Axe, to follow up on the patient's  CBC and if patient is still persistently neutropenic, arrange referral to  hematology at his own discretion.      Isidor Holts, M.D.  Electronically Signed     CO/MEDQ  D:  04/06/2005  T:  04/07/2005  Job:  [id number]   cc:   Fanny Dance. Rankins, M.D.  Fax: [phone]

## 2010-06-18 NOTE — Consult Note (Signed)
[facility]  Patient:    [name], [name] Visit Number: [id number] MRN: [id number]          Service Type: MED Location: MICU 2108 01 Attending Physician:  Farley Ly Dictated by:   Carlisle Beers. Dorothyann Gibbs, M.D. Admit Date:  05/24/2001 Discharge Date: 05/25/2001                            Consultation Report  CHIEF COMPLAINT:  Sepsis with bulla, left calf.  HISTORY OF PRESENT ILLNESS:  This 65 year old black female was admitted with what has turned out to be group A septicemia.  She has been also treated for TB.  She has history of alcohol abuse, tobacco abuse, and chronic poor health. She has been found to be group A Strep septicemic and in the last two days developed a large bulla, left calf.  Concern is muscle involvement.  Patient information sheet and medical history are extensively reviewed.  PHYSICAL EXAMINATION  GENERAL:  Slender black female in obvious discomfort.  EXTREMITIES:  Left calf discolored and swollen.  In the medial gastrocnemius area there is about a 3 x 1.5 inch bulla ballooning about 1 inch from the normal tissue.  The calf muscle underlying does appear to be sore.  She has good pulses in the foot.  In the ______ leg now there is a 1 x 2 cm bulla that is superficial.  There appears to be edema medially superficial to the muscle and this was not present a number of hours ago according to the ICU nurse.  IMPRESSION:  Strep cellulitis bilateral calf involvement with skin bullae and probable muscle involvement left leg.  The right calf is rapidly changing.  RECOMMENDATIONS:  I personally have no experience with necrotizing fasciitis. I have spoken to Dr. Maurice March and he says that the general surgeons have generally cared for these.  I would defer to Dr. Daphine Deutscher of general surgery but my own inclination would be to I&D the left calf or at least get an MRI of the calf to evaluate the muscle if an I&D is not done.  The right  calf cellulitis is apparently rapidly developing and is cause for concern and may need I&D there also depending on severity of findings in the other leg.  I would be happy to discuss this with Dr. Daphine Deutscher. Dictated by:   Carlisle Beers. Dorothyann Gibbs, M.D. Attending Physician:  Farley Ly DD:  05/27/01 TD:  05/28/01 Job: [id number]

## 2010-06-18 NOTE — H&P (Signed)
[name], [name]               ACCOUNT NO.:  [id number]   MEDICAL RECORD NO.:  [id number]          PATIENT TYPE:  EMS   LOCATION:  MAJO                         FACILITY:  [facility]   PHYSICIAN:  Mobolaji B. Bakare, M.D.DATE OF BIRTH:  17-Aug-1945   DATE OF ADMISSION:  03/30/2005  DATE OF DISCHARGE:                                HISTORY & PHYSICAL   PRIMARY CARE PHYSICIAN:  Dr. Luciana [facility]xe at [facility] The patient is  unassigned.   CHIEF COMPLAINT:  Fever for five days.   HISTORY OF PRESENT ILLNESS:  [name] is a 65 year old African American  female with no known chronic medical problems. She is not on any chronic  medications. She started experiencing fever, chills, and cold on Saturday,  five days ago when she was home from work. Episodes of nausea and vomiting  with diarrhea which lasted for three days. There was no abdominal pain,  hematochezia, or hematemesis. He started experiencing back pain on the right  side about two days ago. She denies dysuria, urgency, or increased frequency  of micturition. She was brought to the emergency room by her grandson.   Initial evaluation in the emergency room revealed a temperature 104.1, blood  pressure 119/64 and it later dropped to 94/62, pulse rate of 90, and  saturating at 94% on room air. Laboratory workup revealed absolute  neutrophils of 600 (white count 1.8 and percentage granulocytes on absolute  neutrophils was 32). Review of E-chart revealed that [name] has a  history of neutropenia. She was admitted in April 2003 with cavitary  necrotizing pneumonia with neutropenia. She ended up having bacteremia. At  that time she was by Dr. Darrold Span and it was felt that the neutropenia was  secondary to longstanding alcohol abuse.  I cannot tell if the white cell  count has decreased since that time.   She was given ciprofloxacin 400 mg in the emergency room and she is  currently on IV fluids.   REVIEW OF SYSTEMS:  Positive for  headaches. She has chronic cough. She is a  chronic smoker. She denies chest pain or shortness of breath. She feels  weak, fatigued, has anorexia. There has been no altered mental status.   PAST MEDICAL HISTORY:  Hospitalization in April 2003 for necrotizing  pneumonia, strep bacteremia. She had sepsis and drug fever (which was  thought  to be secondary to Rocephin and clindamycin). During that  hospitalization she was treated with Zosyn, Tequin, and there was no allergy  to both.   PAST SURGICAL HISTORY:  Hysterectomy in 1981 for tumor, fibroids.   MEDICATIONS:  None.   ALLERGIES:  The patient denies any allergy, but according to the E-chart  record she developed drug fever and rash to CLINDAMYCIN and ROCEPHIN.   FAMILY HISTORY:  She is divorced and has one daughter. Father passed away  from lung cancer. Mother is alive and well at the age of 5.  She has no  known medical conditions.   SOCIAL HISTORY:  The patient has been smoking cigarettes since the age of  62. She tried to quit in  2003, but she did not succeed. She is still  currently smoking cigarettes about one pack per week. She drinks one beer  per day, she had a history of alcohol abuse in the past.  She denies the use  of drugs. 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   PHYSICAL EXAMINATION:  VITAL SIGNS: On admission, temperature 104.1, blood  pressure 119/64, pulse 90, respiratory rate 20, O2 saturations of 95%.  GENERAL: The patient is alert and oriented, not in respiratory distress.  HEENT: Normocephalic and atraumatic head. Pupils equal, round, and reactive  to light. Extraocular movements intact. No elevated JVD  or carotid bruits.  No neck stiffness. No nuchal rigidity. Oropharynx dry. There is oral thrush  on the tongue.  LUNGS: Diffuse air entry bilaterally. No wheeze or rhonchi. No crackles.  CVS: S1 and S2 regular.  No murmurs, rubs, or gallops.  ABDOMEN: Nondistended, soft,  nontender. No palpable organomegaly. Bowel  sounds present.  BACK: She has costovertebral angle tenderness.  EXTREMITIES: No pedal edema, calf tenderness, or cyanosis.  CNS: No focal neurologic deficits.   INITIAL LABORATORY DATA:  A&B antigen negative. Urinalysis showed cloudy  urine, specific gravity  1.023, moderate blood, protein greater than 300,  positive nitrites, leukocytes small, white cells 21-50, many bacteria.  Sodium 128, potassium 3.0, chloride 95, CO2 23, glucose 126, BUN 5,  creatinine 0.9, bilirubin 0.7. AST 39. ALT 21, alkaline phosphatase 62,  total protein 6.6, albumin 3.2, calcium 7.9. White cell count 1.8,  hemoglobin 13.1, hematocrit 38.3, MCV 89.8, platelet count 129,000,  neutrophils 22%, lymphocytes 54%. There is mild left shift with occasional  bands and atypical lymphocytes. Lipase is 28.   Chest x-ray showed central bronchial thickening and hyperinflation. No acute  infiltrates.   ASSESSMENT/PLAN:  [name] is a 55 year old African American female  presenting with fever, chills, nausea, vomiting. She is noted to have a  positive urinalysis suggestive of infection. She has costovertebral angle  tenderness. She was noted to have neutropenia and thrombocytopenia. Blood  pressure was relatively low on admission.  1.  Neutropenic fever. The source of infection is most likely acute      pyelonephritis with early sepsis. Will admit to the stepdown unit, start      combination antibiotic coverage with Zosyn 3.375 gm IV q.6h. and      ciprofloxacin 400 mg IV q.12h. Will obtain blood culture, urine culture,      and check lactic acid level. I will hydrate aggressively with normal      saline infusion.  2.  Dehydration secondary to nausea, vomiting, and diarrhea. Associated      gastroenteritis appears to have resolved in the last two days.  If      diarrhea resumes will check stool for C. difficile toxin, fecal     leukocytes, and cultures. Meanwhile, I will  place on contact isolation,      hydrate as noted above.  3.  Hyponatremia/hypokalemia. This is most likely secondary to poor p.o.      intake and hypovolemia. Replete  potassium with potassium chloride p.o.      in anticipation that sodium will improve with hydration.  4.  Thrombocytopenia. I do not have a record of any recent platelets. It was      also noted in 2003 that the patient had thrombocytopenia. This may be      acute related to infection, may be a chronic problem. Will  monitor      platelets and avoid heparin products for now. Will check DIC panel.  5.  Neutropenia. She does have a history of neutropenia during      hospitalization in 2003. At that time absolute neutrophil count was 0.8.      The patient would benefit from hematology evaluation and follow-up at      some point.  6.  Oral thrush. Will start on Diflucan 200 mg IV times one and then 100 mg      daily. Will check HIV status.  7.  Tobacco abuse. Start nicotine patch 24 mg daily and we will give tobacco      cessation counseling.  8.  GI prophylaxis. IV Protonix.  9.  Deep venous thrombosis prophylaxis. Will avoid heparin for now secondary      to relatively low platelets. Will give sequential compression device.      Mobolaji B. Corky Downs, M.D.  Electronically Signed     MBB/MEDQ  D:  03/30/2005  T:  03/30/2005  Job:  [id number]

## 2010-06-18 NOTE — Consult Note (Signed)
[place]. [name]  Patient:    [name], [name] Visit Number: [id number] MRN: [id number]          Service Type: MED Location: [phone] Attending Physician:  [name] Dictated by:   [name], D.D.S. Proc. Date: 06/12/01 Admit Date:  05/24/2001 Discharge Date: 06/15/2001   CC:         [name], M.D.  [name], M.D.  [name], M.D.   Consultation Report  DATE OF BIRTH:  07-24-1945  REQUESTING PHYSICIAN:  [name], M.D.  [name] is a 65 year old female referred by [name] for dental consultation.  Patient was admitted with pneumonia and was subsequently diagnosed with a group A Streptococcal septicemia.  Patient has been undergoing antibiotic therapy.  Dr. Darrold Campos evaluated the patient for chronic neutropenia.  A dental consultation was requested to rule out dental infection which may affect the patients systemic health.  Patient known to have a poor dentition.  PAST MEDICAL HISTORY: 1. Pneumonia, reason for this admission. 2. Group A Streptococcal septicemia.    a. Status post IV and oral antibiotic therapy.    b. History of bilateral lower extremity edema and epidermolysis. 3. History of alcoholism. 4. COPD and tobacco abuse. 5. History of chronic neutropenia. 6. Status post total abdominal hysterectomy, remote.  ALLERGIES/ADVERSE DRUG REACTIONS:  None known.  MEDICATIONS: 1. Thiamine 100 mg daily. 2. Folic acid 1 mg daily. 3. Ventolin inhalation therapy q.6h. 4. Colace 100 mg b.i.d. 5. Silvadene topically b.i.d. 6. Lovenox subcutaneously 40 mg daily. 7. Tequin 400 mg daily. 8. Indocin 50 mg t.i.d. 9. Lortab 5/500 one q.6h. p.r.n. for pain.  SOCIAL HISTORY:  Patient is divorced and has one daughter.  Patient lives with her daughter, [name].  Patient quit smoking approximately one month ago. Patient had previously had a three and a half pack per day smoking  habit for the past 45 years.  Patient quit drinking approximately three weeks ago as well.  Patient indicates that she drank approximately two quarts of Miller beer daily.  FAMILY HISTORY:  Mother is alive at the age of 80 and is healthy.  Father is deceased at the age of 64 with a history of cancer of the throat.  FUNCTIONAL ASSESSMENT:  Patient was independent for ADLs prior to this admission.  REVIEW OF SYSTEMS:  This is reviewed from the chart and health history assessment form this admission.  DENTAL HISTORY  CHIEF COMPLAINT:  Dental consultation was requested to rule out dental infection which may affect the patients systemic health.  HISTORY OF PRESENT ILLNESS:  Patient was admitted with pneumonia and subsequently diagnosed with group A Streptococcal septicemia.  Patient has been undergoing IV antibiotic therapy.  Dental consultation was requested to rule out dental infection which may affect the patients systemic health.  Patient currently denies a history of toothaches, swellings, or abscesses at this time.  Patient indicates that she last saw the dentist approximately 30+ years ago to have an upper complete denture made.  Patient indicates that the denture broke approximately one year ago.  Patient denies the presence of a lower partial denture.  Patient knows that she has multiple bad teeth which "need to come out."  Patient is interested in having these teeth extracted at this time.  DENTAL EXAMINATION:  GENERAL:  Patient is a well-developed, slightly built female in no acute distress.  VITAL SIGNS:  Blood pressure 116/69, pulse equal to 83, respirations  18, temperature 99.5.  NECK:  There is no palpable lymphadenopathy.  There are no acute TMJ symptoms.  HEENT:  Intraoral examination:  Patient has normal saliva.  There is no evidence of abscess formation.  Dentition:  Patient is missing multiple teeth #1-16, 17-20, and 28-32.  Patient has root segments #21 and  22 present. Dental caries:  There are rampant dental caries affecting the remaining dentition.  Periodontal:  Patient with chronic periodontitis with plaque and calculus accumulations, gingival recession, tooth mobility, and moderate to severe horizontal and vertical bone loss.  Endodontic:  Patient denies acute pulpitis symptoms at this time.  Patient appears to have periapical radiolucency associated with apices of tooth #21 and 22.  Crown and bridge: There are no crown and bridge restorations noted.  Prosthodontic:  Patient with a history of upper complete denture made 30+ years ago.  Patient indicates that the denture is broken at this time.  Patient will most likely benefit from preprosthetic surgery with removal of the bilateral tuberosities of the maxillary arch if the patient is medically stable.  Occlusion:  Patient with a poor occlusal scheme secondary to multiple missing teeth, presence of root segments, and lack of replacement of the missing teeth with clinically acceptable dental restorations.  Radiographic interpretation:  A panoramic x-ray was taken on Jun 11, 2001.  There are multiple missing teeth.  There is moderate to severe horizontal and vertical bone loss.  There are dental caries noted.  There are root segments #21 and 22.  There is a probable periapical radiolucency associated with tooth #21 and 22.  There are dental caries noted. There is poor occlusal scheme noted.  ASSESSMENT:  1. Plaque and calculus accumulations.  2. Chronic periodontitis with bone loss.  3. Generalized gingival recession.  4. Tooth mobility.  5. Multiple dental caries.  6. Periapical radiolucency associated with apices of tooth #21 and 22.  7. No acute pulpitis symptoms, however.  8. Multiple missing teeth.  9. Root segments #21 and 22. 10. Poor occlusal scheme.  11. History of broken upper complete denture and lack of replacement of the     missing teeth with clinically acceptable  dental restorations. 12. Poor occlusal scheme. 13. Current Lovenox therapy with a risk for bleeding associated with invasive     dental procedures. 14. Questionable need for antibiotic premedication prior to invasive dental     procedures.  PLAN/RECOMMENDATIONS: 1. I discussed the risks, benefits, and complications of various treatment    options with the patient in relationship to her medical and dental    conditions.  We discussed no treatment, extraction of her remaining teeth,    alveoloplasty, bilateral tuberosity reductions, periodontal therapy, dental    restorations, crown and bridge therapy, root canal therapy, implant    therapy, and the replacement of missing teeth as indicated.  Patient    indicates that she wishes to proceed with extraction of her remaining teeth    with alveoloplasty at this time.  If the patient is medically stable she    also wishes to proceed with bilateral maxillary tuberosity reductions as    her condition permits in the operating room.  Patient is aware that she    will follow up with a private dentist of her choice for the fabrication of    the upper and lower complete dentures by the private dentist of her choice    as indicated. 2. Discussion of findings with the physicians to evaluate the patients  medical ability/stability to undergo dental procedures as planned, ability    to withhold Lovenox therapy as indicated, and questionable need for    antibiotic premedication if so desired.  Will discuss with Dr. Alfonse Alpers as he    appears to be the physician following the patient at this time. 3. Will discuss findings with Dr. Darrold Campos as indicated. 4. Will schedule an operating room procedure as time and space permits, most    likely on Thursday, Jun 14, 2001 in the morning. Dictated by:   [name], D.D.S. Attending Physician:  [name] DD:  06/13/01 TD:  06/15/01 Job: [id number]

## 2010-06-18 NOTE — Consult Note (Signed)
[place]. [facility]  Patient:    [name], [name] Visit Number: [id number] MRN: [id number]          Service Type: MED Location: MICU 2108 [id number] Attending Physician:  Farley Ly Dictated by:   Charlcie Cradle Delford Field, M.D. [facility] Proc. Date: 05/26/01 Admit Date:  05/24/2001 Discharge Date: 05/25/2001   CC:         Farley Ly, M.D.  Fransisco Hertz, M.D.  Lacretia Leigh. Ninetta Lights, M.D.   Consultation Report  CHIEF COMPLAINT:  Respiratory distress, tachycardia, and right upper lobe cavitary lesion.  HISTORY OF PRESENT ILLNESS:  A 65 year old African-American female presented on May 24, 2001 with a 4-day history of blood-tinged sputum, fever, chills, myalgias, nausea and vomiting, said she was in the usual state of health until 4 days ago when she started coughing, noting also headaches. She has poor dentition, has had decreased appetite, decreased oral intake, no diarrhea, no other change in bowel pattern. Denied any chest pain. Initial chest radiograph and CT scan done on May 24, 2001 shows a complex cystic thick-walled lesion in the right upper lobe suspicious for tuberculosis or fungal disease. There were no other acute infiltrates seen. However, I have not been able to review any of these radiographs. The patient has not been able to produce sputum for AFB smears. She is in an isolation room in 3300, and by the time I came to see the patient, she was in some distress with tachycardia, increased respiratory rate, and declining functional status. The patient has had neutropenia by history since July of 1999 and still has persisting neutropenia. A history of questionable COPD, does have chronic alcoholism, and was drinking recently. Hematology is following the patients neutropenia. The patient also came in with a relatively low platelet count which has continued to fall during this hospitalization, and significant positive blood cultures for  Streptococcus pyogenes is noted.  PAST MEDICAL HISTORY:  Medical as noted above. She had a negative HIV test in 1999.  SOCIAL HISTORY:  Smokes three packs a day. Lives with daughter. Ethanol use as noted above.  FAMILY HISTORY:  Positive for TB.  MEDICATIONS PRIOR TO ADMISSION:  None.  ALLERGIES:  None.  PAST SURGICAL HISTORY:  Hysterectomy in the past due to fibroids.  PHYSICAL EXAMINATION:  VITAL SIGNS:  T-max 102, blood pressure 110/60, pulse rate 138-140, respirations 35-40.  GENERAL:  The patients in some distress with a hypersalivation on 3 liter cannula saturation 99%.  CHEST:  Scattered rales, a few expiratory wheezes, poor air movement.  CARDIAC:  Resting tachycardia without S3, normal S1 & S2.  ABDOMEN:  Soft, nontender, bowel sounds active.  EXTREMITIES:  Tenderness in the lower extremities, no edema or clubbing.  NEUROLOGICAL:  Intact. The patient is somewhat confused though.  LABORATORY AND ACCESSORY DATA:  White count initially was 0.8 it is now 1.5 and todays result I see is still pending. Hemoglobin 12, platelet count 97,000. There is a relative neutropenia on the differential. On room air blood gas 7.51, PCO2 31, PO2 70, that was on May 23, 2001. Sodium 129, potassium 2.6, chloride 98, CO2 20, BUN 12, creatinine 1.2, calcium 7.2, blood sugar 118. Blood cultures obtained on May 24, 2001 are all positive for Streptococcus pyogenes and there are 4 sets of blood cultures that are positive on this date. Legionella urine antigen is negative. C. difficile stool is pending.  IMPRESSION:  This is a severely ill 65 year old African-American female who has very high  potential for major decompensation, with chronic neutropenia and bone marrow suppression, probable developing severe sepsis, with positive blood cultures for Streptococcus pyogenes associated with low white count and progressively falling platelet count, metabolic acidosis with increased  anion gap, increased heart rate, increased respiratory rate, low white count, and fever, all suggestive of 404 (SIRS) systemic inflammatory response syndrome criteria. The patient is not in shock and renal is okay. I question the patients cavity in the right upper lobe is related to the aseptic process, or is this a separate issue with regards to possible tuberculosis or underlying fungal disease. The patient may also have underlying immunosuppressed state, underlying malignancy such as lymphoma. I am concerned about this patient and fear she needs intensive care level of monitoring. I also wonder about subacute bacterial endocarditis. She does have poor dentition and a transesophageal echocardiography will be necessary at some point.  RECOMMENDATIONS: 1. Transfer to ICU. 2. Give IV fluid bolus. 3. Follow up AFB smears and fungal smears of the sputum prior to performing    bronchoscopy. No bronchoscopy for now because the patient is too    hemodynamically unstable. Incidentally, the patients films were not able    to be located in radiology and I will await their review. Check DIC panel.    I agree with antibiotics as outlined by the infectious disease service.    Begin neb treatments. Check lactic acid level. Follow up CBC and blood    gases. Avoid heparin. Dictated by:   Charlcie Cradle Delford Field, M.D. [facility] Attending Physician:  Farley Ly DD:  05/26/01 TD:  05/27/01 Job: [id number]

## 2010-07-09 ENCOUNTER — Other Ambulatory Visit (HOSPITAL_COMMUNITY): Payer: Self-pay | Admitting: Family Medicine

## 2010-07-09 DIAGNOSIS — Z1231 Encounter for screening mammogram for malignant neoplasm of breast: Secondary | ICD-10-CM

## 2010-08-09 ENCOUNTER — Ambulatory Visit (HOSPITAL_COMMUNITY)
Admission: RE | Admit: 2010-08-09 | Discharge: 2010-08-09 | Disposition: A | Discharge status: Home / Self Care | Payer: MEDICARE | Source: Ambulatory Visit | Attending: Family Medicine | Admitting: Family Medicine

## 2010-08-09 DIAGNOSIS — Z1231 Encounter for screening mammogram for malignant neoplasm of breast: Secondary | ICD-10-CM | POA: Insufficient documentation

## 2011-06-23 ENCOUNTER — Other Ambulatory Visit: Payer: Self-pay | Admitting: Gastroenterology

## 2011-07-04 ENCOUNTER — Other Ambulatory Visit (HOSPITAL_COMMUNITY): Payer: Self-pay | Admitting: Internal Medicine

## 2011-07-04 DIAGNOSIS — Z1231 Encounter for screening mammogram for malignant neoplasm of breast: Secondary | ICD-10-CM

## 2011-08-12 ENCOUNTER — Ambulatory Visit (HOSPITAL_COMMUNITY)
Admission: RE | Admit: 2011-08-12 | Discharge: 2011-08-12 | Disposition: A | Discharge status: Home / Self Care | Payer: PRIVATE HEALTH INSURANCE | Source: Ambulatory Visit | Attending: Internal Medicine | Admitting: Internal Medicine

## 2011-08-12 DIAGNOSIS — Z1231 Encounter for screening mammogram for malignant neoplasm of breast: Secondary | ICD-10-CM

## 2011-08-16 ENCOUNTER — Other Ambulatory Visit: Payer: Self-pay | Admitting: Internal Medicine

## 2011-08-16 ENCOUNTER — Other Ambulatory Visit: Payer: Self-pay | Admitting: Gastroenterology

## 2011-08-16 DIAGNOSIS — K635 Polyp of colon: Secondary | ICD-10-CM

## 2011-08-16 DIAGNOSIS — R928 Other abnormal and inconclusive findings on diagnostic imaging of breast: Secondary | ICD-10-CM

## 2011-08-19 ENCOUNTER — Ambulatory Visit
Admission: RE | Admit: 2011-08-19 | Discharge: 2011-08-19 | Disposition: A | Discharge status: Home / Self Care | Payer: PRIVATE HEALTH INSURANCE | Source: Ambulatory Visit | Attending: Internal Medicine | Admitting: Internal Medicine

## 2011-08-19 DIAGNOSIS — R928 Other abnormal and inconclusive findings on diagnostic imaging of breast: Secondary | ICD-10-CM

## 2011-09-09 ENCOUNTER — Ambulatory Visit
Admission: RE | Admit: 2011-09-09 | Discharge: 2011-09-09 | Disposition: A | Discharge status: Home / Self Care | Payer: PRIVATE HEALTH INSURANCE | Source: Ambulatory Visit | Attending: Gastroenterology | Admitting: Gastroenterology

## 2011-09-09 DIAGNOSIS — K635 Polyp of colon: Secondary | ICD-10-CM

## 2012-07-10 ENCOUNTER — Other Ambulatory Visit (HOSPITAL_COMMUNITY): Payer: Self-pay | Admitting: Internal Medicine

## 2012-07-10 DIAGNOSIS — Z1231 Encounter for screening mammogram for malignant neoplasm of breast: Secondary | ICD-10-CM

## 2012-08-17 ENCOUNTER — Ambulatory Visit (HOSPITAL_COMMUNITY)
Admission: RE | Admit: 2012-08-17 | Discharge: 2012-08-17 | Disposition: A | Discharge status: Home / Self Care | Payer: PRIVATE HEALTH INSURANCE | Source: Ambulatory Visit | Attending: Internal Medicine | Admitting: Internal Medicine

## 2012-08-17 DIAGNOSIS — Z1231 Encounter for screening mammogram for malignant neoplasm of breast: Secondary | ICD-10-CM | POA: Insufficient documentation

## 2013-07-17 ENCOUNTER — Other Ambulatory Visit (HOSPITAL_COMMUNITY): Payer: Self-pay | Admitting: Internal Medicine

## 2013-07-17 DIAGNOSIS — Z1231 Encounter for screening mammogram for malignant neoplasm of breast: Secondary | ICD-10-CM

## 2013-08-19 ENCOUNTER — Ambulatory Visit (HOSPITAL_COMMUNITY)
Admission: RE | Admit: 2013-08-19 | Discharge: 2013-08-19 | Disposition: A | Discharge status: Home / Self Care | Payer: Medicare Other | Source: Ambulatory Visit | Attending: Internal Medicine | Admitting: Internal Medicine

## 2013-08-19 DIAGNOSIS — Z1231 Encounter for screening mammogram for malignant neoplasm of breast: Secondary | ICD-10-CM | POA: Insufficient documentation

## 2014-07-23 ENCOUNTER — Other Ambulatory Visit (HOSPITAL_COMMUNITY): Payer: Self-pay | Admitting: Internal Medicine

## 2014-07-23 DIAGNOSIS — Z1231 Encounter for screening mammogram for malignant neoplasm of breast: Secondary | ICD-10-CM

## 2014-08-22 ENCOUNTER — Ambulatory Visit (HOSPITAL_COMMUNITY): Payer: Self-pay

## 2014-08-27 ENCOUNTER — Ambulatory Visit (HOSPITAL_COMMUNITY): Payer: Self-pay

## 2014-09-11 ENCOUNTER — Ambulatory Visit (HOSPITAL_COMMUNITY): Payer: Self-pay

## 2014-09-17 ENCOUNTER — Ambulatory Visit (HOSPITAL_COMMUNITY): Payer: Self-pay

## 2014-09-23 ENCOUNTER — Ambulatory Visit (HOSPITAL_COMMUNITY): Payer: Self-pay

## 2014-10-01 ENCOUNTER — Ambulatory Visit (HOSPITAL_COMMUNITY): Payer: Self-pay

## 2014-10-14 ENCOUNTER — Ambulatory Visit (HOSPITAL_COMMUNITY): Payer: Self-pay

## 2014-10-30 ENCOUNTER — Ambulatory Visit (HOSPITAL_COMMUNITY): Payer: Self-pay

## 2015-01-14 ENCOUNTER — Other Ambulatory Visit: Payer: Self-pay | Admitting: Internal Medicine

## 2015-01-14 DIAGNOSIS — Z1231 Encounter for screening mammogram for malignant neoplasm of breast: Secondary | ICD-10-CM

## 2015-01-14 DIAGNOSIS — E2839 Other primary ovarian failure: Secondary | ICD-10-CM

## 2015-02-20 ENCOUNTER — Ambulatory Visit: Payer: Self-pay

## 2015-02-20 ENCOUNTER — Inpatient Hospital Stay: Admission: RE | Admit: 2015-02-20 | Payer: Self-pay | Source: Ambulatory Visit

## 2015-03-20 ENCOUNTER — Ambulatory Visit
Admission: RE | Admit: 2015-03-20 | Discharge: 2015-03-20 | Disposition: A | Discharge status: Home / Self Care | Payer: PPO | Source: Ambulatory Visit | Attending: Internal Medicine | Admitting: Internal Medicine

## 2015-03-20 DIAGNOSIS — Z1231 Encounter for screening mammogram for malignant neoplasm of breast: Secondary | ICD-10-CM | POA: Diagnosis not present

## 2015-03-20 DIAGNOSIS — E2839 Other primary ovarian failure: Secondary | ICD-10-CM

## 2015-03-20 DIAGNOSIS — M810 Age-related osteoporosis without current pathological fracture: Secondary | ICD-10-CM | POA: Diagnosis not present

## 2015-03-20 DIAGNOSIS — Z780 Asymptomatic menopausal state: Secondary | ICD-10-CM | POA: Diagnosis not present

## 2015-03-25 ENCOUNTER — Other Ambulatory Visit: Payer: Self-pay | Admitting: Internal Medicine

## 2015-03-25 DIAGNOSIS — R928 Other abnormal and inconclusive findings on diagnostic imaging of breast: Secondary | ICD-10-CM

## 2015-03-31 ENCOUNTER — Ambulatory Visit
Admission: RE | Admit: 2015-03-31 | Discharge: 2015-03-31 | Disposition: A | Discharge status: Home / Self Care | Payer: PPO | Source: Ambulatory Visit | Attending: Internal Medicine | Admitting: Internal Medicine

## 2015-03-31 DIAGNOSIS — R928 Other abnormal and inconclusive findings on diagnostic imaging of breast: Secondary | ICD-10-CM

## 2015-04-27 DIAGNOSIS — H16223 Keratoconjunctivitis sicca, not specified as Sjogren's, bilateral: Secondary | ICD-10-CM | POA: Diagnosis not present

## 2015-04-27 DIAGNOSIS — D2310 Other benign neoplasm of skin of unspecified eyelid, including canthus: Secondary | ICD-10-CM | POA: Diagnosis not present

## 2015-04-27 DIAGNOSIS — H2513 Age-related nuclear cataract, bilateral: Secondary | ICD-10-CM | POA: Diagnosis not present

## 2015-10-16 DIAGNOSIS — F17210 Nicotine dependence, cigarettes, uncomplicated: Secondary | ICD-10-CM | POA: Diagnosis not present

## 2015-10-16 DIAGNOSIS — J209 Acute bronchitis, unspecified: Secondary | ICD-10-CM | POA: Diagnosis not present

## 2015-10-16 DIAGNOSIS — Z13220 Encounter for screening for lipoid disorders: Secondary | ICD-10-CM | POA: Diagnosis not present

## 2015-10-16 DIAGNOSIS — Z79899 Other long term (current) drug therapy: Secondary | ICD-10-CM | POA: Diagnosis not present

## 2015-10-16 DIAGNOSIS — Z131 Encounter for screening for diabetes mellitus: Secondary | ICD-10-CM | POA: Diagnosis not present

## 2016-03-23 ENCOUNTER — Other Ambulatory Visit: Payer: Self-pay | Admitting: Internal Medicine

## 2016-03-23 DIAGNOSIS — Z1231 Encounter for screening mammogram for malignant neoplasm of breast: Secondary | ICD-10-CM

## 2016-04-08 ENCOUNTER — Ambulatory Visit
Admission: RE | Admit: 2016-04-08 | Discharge: 2016-04-08 | Disposition: A | Discharge status: Home / Self Care | Payer: PPO | Source: Ambulatory Visit | Attending: Internal Medicine | Admitting: Internal Medicine

## 2016-04-08 DIAGNOSIS — Z1231 Encounter for screening mammogram for malignant neoplasm of breast: Secondary | ICD-10-CM

## 2016-04-21 DIAGNOSIS — H2513 Age-related nuclear cataract, bilateral: Secondary | ICD-10-CM | POA: Diagnosis not present

## 2016-04-21 DIAGNOSIS — H16223 Keratoconjunctivitis sicca, not specified as Sjogren's, bilateral: Secondary | ICD-10-CM | POA: Diagnosis not present

## 2016-04-21 DIAGNOSIS — H43813 Vitreous degeneration, bilateral: Secondary | ICD-10-CM | POA: Diagnosis not present

## 2017-03-27 ENCOUNTER — Other Ambulatory Visit: Payer: Self-pay | Admitting: Internal Medicine

## 2017-03-27 DIAGNOSIS — Z1231 Encounter for screening mammogram for malignant neoplasm of breast: Secondary | ICD-10-CM

## 2017-04-14 ENCOUNTER — Ambulatory Visit: Payer: PPO

## 2018-10-23 ENCOUNTER — Other Ambulatory Visit: Payer: Self-pay | Admitting: Internal Medicine

## 2018-10-23 DIAGNOSIS — Z1231 Encounter for screening mammogram for malignant neoplasm of breast: Secondary | ICD-10-CM

## 2018-12-10 ENCOUNTER — Ambulatory Visit: Payer: PPO

## 2019-01-31 ENCOUNTER — Ambulatory Visit
Admission: RE | Admit: 2019-01-31 | Discharge: 2019-01-31 | Disposition: A | Discharge status: Home / Self Care | Payer: Medicare HMO | Source: Ambulatory Visit | Attending: Internal Medicine | Admitting: Internal Medicine

## 2019-01-31 ENCOUNTER — Other Ambulatory Visit: Payer: Self-pay

## 2019-01-31 DIAGNOSIS — Z1231 Encounter for screening mammogram for malignant neoplasm of breast: Secondary | ICD-10-CM

## 2019-12-02 ENCOUNTER — Other Ambulatory Visit: Payer: Self-pay | Admitting: Internal Medicine

## 2019-12-02 DIAGNOSIS — Z1231 Encounter for screening mammogram for malignant neoplasm of breast: Secondary | ICD-10-CM

## 2020-02-04 ENCOUNTER — Ambulatory Visit: Payer: PPO

## 2020-03-13 ENCOUNTER — Other Ambulatory Visit: Payer: Self-pay

## 2020-03-13 ENCOUNTER — Ambulatory Visit
Admission: RE | Admit: 2020-03-13 | Discharge: 2020-03-13 | Disposition: A | Discharge status: Home / Self Care | Payer: Medicare HMO | Source: Ambulatory Visit | Attending: Internal Medicine | Admitting: Internal Medicine

## 2020-03-13 DIAGNOSIS — Z1231 Encounter for screening mammogram for malignant neoplasm of breast: Secondary | ICD-10-CM

## 2020-05-12 DIAGNOSIS — M62838 Other muscle spasm: Secondary | ICD-10-CM | POA: Diagnosis not present

## 2020-05-12 DIAGNOSIS — M1990 Unspecified osteoarthritis, unspecified site: Secondary | ICD-10-CM | POA: Diagnosis not present

## 2020-05-12 DIAGNOSIS — G8929 Other chronic pain: Secondary | ICD-10-CM | POA: Diagnosis not present

## 2020-05-12 DIAGNOSIS — Z681 Body mass index (BMI) 19 or less, adult: Secondary | ICD-10-CM | POA: Diagnosis not present

## 2020-05-12 DIAGNOSIS — Z8551 Personal history of malignant neoplasm of bladder: Secondary | ICD-10-CM | POA: Diagnosis not present

## 2020-05-12 DIAGNOSIS — Z801 Family history of malignant neoplasm of trachea, bronchus and lung: Secondary | ICD-10-CM | POA: Diagnosis not present

## 2020-05-12 DIAGNOSIS — K08109 Complete loss of teeth, unspecified cause, unspecified class: Secondary | ICD-10-CM | POA: Diagnosis not present

## 2020-05-12 DIAGNOSIS — Z8249 Family history of ischemic heart disease and other diseases of the circulatory system: Secondary | ICD-10-CM | POA: Diagnosis not present

## 2020-05-12 DIAGNOSIS — R636 Underweight: Secondary | ICD-10-CM | POA: Diagnosis not present

## 2020-05-12 DIAGNOSIS — Z833 Family history of diabetes mellitus: Secondary | ICD-10-CM | POA: Diagnosis not present

## 2020-05-12 DIAGNOSIS — F17210 Nicotine dependence, cigarettes, uncomplicated: Secondary | ICD-10-CM | POA: Diagnosis not present

## 2020-07-09 DIAGNOSIS — H2513 Age-related nuclear cataract, bilateral: Secondary | ICD-10-CM | POA: Diagnosis not present

## 2020-08-05 DIAGNOSIS — H21562 Pupillary abnormality, left eye: Secondary | ICD-10-CM | POA: Diagnosis not present

## 2020-08-05 DIAGNOSIS — H5709 Other anomalies of pupillary function: Secondary | ICD-10-CM | POA: Diagnosis not present

## 2020-08-05 DIAGNOSIS — H2513 Age-related nuclear cataract, bilateral: Secondary | ICD-10-CM | POA: Diagnosis not present

## 2020-08-05 DIAGNOSIS — H2512 Age-related nuclear cataract, left eye: Secondary | ICD-10-CM | POA: Diagnosis not present

## 2020-08-07 DIAGNOSIS — H2513 Age-related nuclear cataract, bilateral: Secondary | ICD-10-CM | POA: Diagnosis not present

## 2020-09-11 DIAGNOSIS — R634 Abnormal weight loss: Secondary | ICD-10-CM | POA: Diagnosis not present

## 2020-09-11 DIAGNOSIS — M179 Osteoarthritis of knee, unspecified: Secondary | ICD-10-CM | POA: Diagnosis not present

## 2020-09-11 DIAGNOSIS — M189 Osteoarthritis of first carpometacarpal joint, unspecified: Secondary | ICD-10-CM | POA: Diagnosis not present

## 2020-09-11 DIAGNOSIS — G2581 Restless legs syndrome: Secondary | ICD-10-CM | POA: Diagnosis not present

## 2020-10-30 DIAGNOSIS — Z716 Tobacco abuse counseling: Secondary | ICD-10-CM | POA: Diagnosis not present

## 2020-10-30 DIAGNOSIS — M179 Osteoarthritis of knee, unspecified: Secondary | ICD-10-CM | POA: Diagnosis not present

## 2020-10-30 DIAGNOSIS — Z23 Encounter for immunization: Secondary | ICD-10-CM | POA: Diagnosis not present

## 2020-10-30 DIAGNOSIS — C679 Malignant neoplasm of bladder, unspecified: Secondary | ICD-10-CM | POA: Diagnosis not present

## 2020-10-30 DIAGNOSIS — Z0000 Encounter for general adult medical examination without abnormal findings: Secondary | ICD-10-CM | POA: Diagnosis not present

## 2020-10-30 DIAGNOSIS — F17210 Nicotine dependence, cigarettes, uncomplicated: Secondary | ICD-10-CM | POA: Diagnosis not present

## 2021-01-20 ENCOUNTER — Other Ambulatory Visit: Payer: Self-pay | Admitting: Internal Medicine

## 2021-01-20 DIAGNOSIS — Z1231 Encounter for screening mammogram for malignant neoplasm of breast: Secondary | ICD-10-CM

## 2021-02-08 DIAGNOSIS — H21561 Pupillary abnormality, right eye: Secondary | ICD-10-CM | POA: Diagnosis not present

## 2021-02-08 DIAGNOSIS — H2511 Age-related nuclear cataract, right eye: Secondary | ICD-10-CM | POA: Diagnosis not present

## 2021-02-08 DIAGNOSIS — H25811 Combined forms of age-related cataract, right eye: Secondary | ICD-10-CM | POA: Diagnosis not present

## 2021-02-12 DIAGNOSIS — M179 Osteoarthritis of knee, unspecified: Secondary | ICD-10-CM | POA: Diagnosis not present

## 2021-02-12 DIAGNOSIS — M169 Osteoarthritis of hip, unspecified: Secondary | ICD-10-CM | POA: Diagnosis not present

## 2021-02-12 DIAGNOSIS — G2581 Restless legs syndrome: Secondary | ICD-10-CM | POA: Diagnosis not present

## 2021-02-12 DIAGNOSIS — F17210 Nicotine dependence, cigarettes, uncomplicated: Secondary | ICD-10-CM | POA: Diagnosis not present

## 2021-03-19 ENCOUNTER — Other Ambulatory Visit: Payer: Self-pay

## 2021-03-19 ENCOUNTER — Ambulatory Visit
Admission: RE | Admit: 2021-03-19 | Discharge: 2021-03-19 | Disposition: A | Discharge status: Home / Self Care | Payer: Medicare HMO | Source: Ambulatory Visit | Attending: Internal Medicine | Admitting: Internal Medicine

## 2021-03-19 ENCOUNTER — Ambulatory Visit: Payer: Medicare HMO

## 2021-03-19 DIAGNOSIS — Z1231 Encounter for screening mammogram for malignant neoplasm of breast: Secondary | ICD-10-CM

## 2021-08-13 DIAGNOSIS — M179 Osteoarthritis of knee, unspecified: Secondary | ICD-10-CM | POA: Diagnosis not present

## 2021-08-13 DIAGNOSIS — Z13220 Encounter for screening for lipoid disorders: Secondary | ICD-10-CM | POA: Diagnosis not present

## 2021-08-13 DIAGNOSIS — Z0000 Encounter for general adult medical examination without abnormal findings: Secondary | ICD-10-CM | POA: Diagnosis not present

## 2021-08-13 DIAGNOSIS — Z131 Encounter for screening for diabetes mellitus: Secondary | ICD-10-CM | POA: Diagnosis not present

## 2021-08-13 DIAGNOSIS — Z1239 Encounter for other screening for malignant neoplasm of breast: Secondary | ICD-10-CM | POA: Diagnosis not present

## 2021-08-13 DIAGNOSIS — G2581 Restless legs syndrome: Secondary | ICD-10-CM | POA: Diagnosis not present

## 2021-08-26 ENCOUNTER — Other Ambulatory Visit (HOSPITAL_COMMUNITY): Payer: Self-pay | Admitting: Physician Assistant

## 2021-08-26 ENCOUNTER — Other Ambulatory Visit: Payer: Self-pay | Admitting: Physician Assistant

## 2021-08-26 DIAGNOSIS — R10819 Abdominal tenderness, unspecified site: Secondary | ICD-10-CM | POA: Diagnosis not present

## 2021-08-26 DIAGNOSIS — R634 Abnormal weight loss: Secondary | ICD-10-CM | POA: Diagnosis not present

## 2021-08-26 DIAGNOSIS — R10817 Generalized abdominal tenderness: Secondary | ICD-10-CM

## 2021-08-26 DIAGNOSIS — Z8551 Personal history of malignant neoplasm of bladder: Secondary | ICD-10-CM | POA: Diagnosis not present

## 2021-08-26 DIAGNOSIS — F17200 Nicotine dependence, unspecified, uncomplicated: Secondary | ICD-10-CM | POA: Diagnosis not present

## 2021-08-26 DIAGNOSIS — Z801 Family history of malignant neoplasm of trachea, bronchus and lung: Secondary | ICD-10-CM | POA: Diagnosis not present

## 2021-09-02 ENCOUNTER — Ambulatory Visit (HOSPITAL_COMMUNITY)
Admission: RE | Admit: 2021-09-02 | Discharge: 2021-09-02 | Disposition: A | Discharge status: Home / Self Care | Payer: Medicare HMO | Source: Ambulatory Visit | Attending: Physician Assistant | Admitting: Physician Assistant

## 2021-09-02 DIAGNOSIS — J439 Emphysema, unspecified: Secondary | ICD-10-CM | POA: Diagnosis not present

## 2021-09-02 DIAGNOSIS — R10817 Generalized abdominal tenderness: Secondary | ICD-10-CM | POA: Diagnosis not present

## 2021-09-02 DIAGNOSIS — I2510 Atherosclerotic heart disease of native coronary artery without angina pectoris: Secondary | ICD-10-CM | POA: Insufficient documentation

## 2021-09-02 DIAGNOSIS — J432 Centrilobular emphysema: Secondary | ICD-10-CM | POA: Diagnosis not present

## 2021-09-02 DIAGNOSIS — R634 Abnormal weight loss: Secondary | ICD-10-CM | POA: Diagnosis not present

## 2021-09-02 DIAGNOSIS — Z8551 Personal history of malignant neoplasm of bladder: Secondary | ICD-10-CM | POA: Insufficient documentation

## 2021-09-02 DIAGNOSIS — I3139 Other pericardial effusion (noninflammatory): Secondary | ICD-10-CM | POA: Diagnosis not present

## 2021-09-02 DIAGNOSIS — J8410 Pulmonary fibrosis, unspecified: Secondary | ICD-10-CM | POA: Diagnosis not present

## 2021-09-02 DIAGNOSIS — K760 Fatty (change of) liver, not elsewhere classified: Secondary | ICD-10-CM | POA: Insufficient documentation

## 2021-09-02 DIAGNOSIS — F17200 Nicotine dependence, unspecified, uncomplicated: Secondary | ICD-10-CM | POA: Diagnosis not present

## 2021-09-02 DIAGNOSIS — K5730 Diverticulosis of large intestine without perforation or abscess without bleeding: Secondary | ICD-10-CM | POA: Diagnosis not present

## 2021-09-02 DIAGNOSIS — I700 Atherosclerosis of aorta: Secondary | ICD-10-CM | POA: Insufficient documentation

## 2021-09-02 DIAGNOSIS — Z720 Tobacco use: Secondary | ICD-10-CM | POA: Diagnosis not present

## 2021-09-02 MED ORDER — IOHEXOL 300 MG/ML  SOLN
80.0000 mL | Freq: Once | INTRAMUSCULAR | Status: AC | PRN
  Administered 2021-09-02: 80 mL via INTRAVENOUS

## 2021-09-02 MED ORDER — SODIUM CHLORIDE (PF) 0.9 % IJ SOLN
INTRAMUSCULAR | Status: AC
  Filled 2021-09-02: qty 50

## 2021-09-10 DIAGNOSIS — M179 Osteoarthritis of knee, unspecified: Secondary | ICD-10-CM | POA: Diagnosis not present

## 2021-09-10 DIAGNOSIS — R634 Abnormal weight loss: Secondary | ICD-10-CM | POA: Diagnosis not present

## 2021-09-10 DIAGNOSIS — Z716 Tobacco abuse counseling: Secondary | ICD-10-CM | POA: Diagnosis not present

## 2021-09-10 DIAGNOSIS — F17210 Nicotine dependence, cigarettes, uncomplicated: Secondary | ICD-10-CM | POA: Diagnosis not present

## 2021-10-01 ENCOUNTER — Ambulatory Visit: Payer: Medicare HMO | Admitting: Pulmonary Disease

## 2021-10-01 ENCOUNTER — Encounter: Payer: Self-pay | Admitting: Pulmonary Disease

## 2021-10-01 VITALS — BP 110/50 | HR 75 | Ht 66.0 in | Wt 98.8 lb

## 2021-10-01 DIAGNOSIS — R911 Solitary pulmonary nodule: Secondary | ICD-10-CM | POA: Diagnosis not present

## 2021-10-01 DIAGNOSIS — Z720 Tobacco use: Secondary | ICD-10-CM | POA: Diagnosis not present

## 2021-10-01 NOTE — Progress Notes (Signed)
Synopsis: Referred in September 2023 for lung nodule by Deliah Goody, PA-C  Subjective:   PATIENT ID: [name]: female DOB: 1945/07/30, MRN: [id number]  Chief Complaint  Patient presents with   Consult    Lung nodule    This is a 76 year old female, past medical history of COPD, bladder cancer, alcohol use. Patient had a CT scan of the chest on 09/02/2021 this revealed a spiculated 1 cm nodule in the right upper lobe concerning for malignancy.  Patient was referred here for consideration of next steps.  Patient has no respiratory complaints.  She is a longstanding history of tobacco use.  She has smoked for 50+ years since the age of 12.  She has no cough no fever no shortness of breath.  Denies hemoptysis.    Past Medical History:  Diagnosis Date   Bladder cancer ([place])    Lung nodule    Tobacco use      Family History  Problem Relation Age of Onset   Breast cancer Neg Hx      Past Surgical History:  Procedure Laterality Date   CYSTOSCOPY     patient thinks at [place] History   Socioeconomic History   Marital status: Divorced    Spouse name: Not on file   Number of children: Not on file   Years of education: Not on file   Highest education level: Not on file  Occupational History   Not on file  Tobacco Use   Smoking status: Every Day    Types: Cigarettes   Smokeless tobacco: Never   Tobacco comments:    Smokes 3 1/2 pack of cigarettes a week. 10/01/2021 [place]  Substance and Sexual Activity   Alcohol use: Not on file   Drug use: Not on file   Sexual activity: Not on file  Other Topics Concern   Not on file  Social History Narrative   ** Merged History Encounter **       Social Determinants of Health   Financial Resource Strain: Not on file  Food Insecurity: Not on file  Transportation Needs: Not on file  Physical Activity: Not on file  Stress: Not on file  Social Connections: Not on file  Intimate Partner Violence: Not on file      Not on File   Outpatient Medications Prior to Visit  Medication Sig Dispense Refill   tiZANidine (ZANAFLEX) 2 MG tablet Take by mouth.     ibuprofen (ADVIL) 800 MG tablet Take 800 mg by mouth 2 (two) times daily as needed.     mirtazapine (REMERON) 7.5 MG tablet SMARTSIG:1 Tablet(s) By Mouth Every Evening     pramipexole (MIRAPEX) 0.25 MG tablet Take 0.25 mg by mouth at bedtime.     No facility-administered medications prior to visit.    Review of Systems  Constitutional:  Negative for chills, fever, malaise/fatigue and weight loss.  HENT:  Negative for hearing loss, sore throat and tinnitus.   Eyes:  Negative for blurred vision and double vision.  Respiratory:  Negative for cough, hemoptysis, sputum production, shortness of breath, wheezing and stridor.   Cardiovascular:  Negative for chest pain, palpitations, orthopnea, leg swelling and PND.  Gastrointestinal:  Negative for abdominal pain, constipation, diarrhea, heartburn, nausea and vomiting.  Genitourinary:  Negative for dysuria, hematuria and urgency.  Musculoskeletal:  Negative for joint pain and myalgias.  Skin:  Negative for itching and rash.  Neurological:  Negative for dizziness, tingling, weakness and headaches.  Endo/Heme/Allergies:  Negative for environmental allergies. Does not bruise/bleed easily.  Psychiatric/Behavioral:  Negative for depression. The patient is not nervous/anxious and does not have insomnia.   All other systems reviewed and are negative.    Objective:  Physical Exam Vitals reviewed.  Constitutional:      General: She is not in acute distress.    Appearance: She is well-developed.     Comments: Thin elderly female  HENT:     Head: Normocephalic and atraumatic.  Eyes:     General: No scleral icterus.    Conjunctiva/sclera: Conjunctivae normal.     Pupils: Pupils are equal, round, and reactive to light.  Neck:     Vascular: No JVD.     Trachea: No tracheal deviation.  Cardiovascular:      Rate and Rhythm: Normal rate and regular rhythm.     Heart sounds: Normal heart sounds. No murmur heard. Pulmonary:     Effort: Pulmonary effort is normal. No tachypnea, accessory muscle usage or respiratory distress.     Breath sounds: No stridor. No wheezing, rhonchi or rales.  Abdominal:     General: There is no distension.     Palpations: Abdomen is soft.     Tenderness: There is no abdominal tenderness.  Musculoskeletal:        General: No tenderness.     Cervical back: Neck supple.  Lymphadenopathy:     Cervical: No cervical adenopathy.  Skin:    General: Skin is warm and dry.     Capillary Refill: Capillary refill takes less than 2 seconds.     Findings: No rash.  Neurological:     Mental Status: She is alert and oriented to person, place, and time.  Psychiatric:        Behavior: Behavior normal.      Vitals:   10/01/21 1516  BP: (!) 110/50  Pulse: 75  SpO2: 97%  Weight: 98 lb 12.8 oz (44.8 kg)  Height: 5\' 6"  (1.676 m)   97% on RA BMI Readings from Last 3 Encounters:  10/01/21 15.95 kg/m   Wt Readings from Last 3 Encounters:  10/01/21 98 lb 12.8 oz (44.8 kg)     CBC    Component Value Date/Time   WBC 2.8 (L) 11/13/2007 2326   RBC 4.11 11/13/2007 2326   HGB 13.3 11/13/2007 2326   HCT 41.4 11/13/2007 2326   PLT 144 (L) 11/13/2007 2326   MCV 100.7 (H) 11/13/2007 2326   MCHC 32.1 11/13/2007 2326   RDW 13.1 11/13/2007 2326   LYMPHSABS 1.9 11/13/2007 2326   MONOABS 0.2 11/13/2007 2326   EOSABS 0.1 11/13/2007 2326   BASOSABS 0.0 11/13/2007 2326    Chest Imaging: 09/02/2021 CT chest: 1 cm right upper lobe spiculated pulmonary nodule concerning for malignancy. The patient's images have been independently reviewed by me.    Pulmonary Functions Testing Results:     No data to display          FeNO:   Pathology:   Echocardiogram:   Heart Catheterization:     Assessment & Plan:     ICD-10-CM   1. Lung nodule  R91.1 NM PET SUPER D CT    CT  Super D Chest Wo Contrast    Procedural/ Surgical Case Request: ROBOTIC ASSISTED NAVIGATIONAL BRONCHOSCOPY    Ambulatory referral to Pulmonology    2. Tobacco use  Z72.0       Discussion:   This is a 76 year old female, longstanding history of tobacco use  found to have a 1 cm spiculated pulmonary nodule concerning for malignancy.  This was found incidentally on CT imaging.  Patient has a longstanding history of tobacco use and a high probability of this being malignancy.  Plan: Today in the office we talked about the risk benefits and alternatives of considering bronchoscopy and biopsy. I think that the next best step to be consideration for PET scan followed by bronchoscopy. This particular case I think it would be great if we can get the PET scan before biopsy. I have ordered also to have a PET super D CT. We talked about the risk benefits and alternatives of proceeding with bronchoscopy and biopsy. We talked the risk of bleeding and pneumothorax. Patient is agreeable to proceed. Tentative bronchoscopy date will be on 10/19/2021.    Current Outpatient Medications:    tiZANidine (ZANAFLEX) 2 MG tablet, Take by mouth., Disp: , Rfl:    ibuprofen (ADVIL) 800 MG tablet, Take 800 mg by mouth 2 (two) times daily as needed., Disp: , Rfl:    mirtazapine (REMERON) 7.5 MG tablet, SMARTSIG:1 Tablet(s) By Mouth Every Evening, Disp: , Rfl:    pramipexole (MIRAPEX) 0.25 MG tablet, Take 0.25 mg by mouth at bedtime., Disp: , Rfl:   I spent 63 minutes dedicated to the care of this patient on the date of this encounter to include pre-visit review of records, face-to-face time with the patient discussing conditions above, post visit ordering of testing, clinical documentation with the electronic health record, making appropriate referrals as documented, and communicating necessary findings to members of the patients care team.   Garner Nash, DO [facility] 10/01/2021 3:49 PM

## 2021-10-01 NOTE — Patient Instructions (Addendum)
Thank you for visiting Dr. Valeta Harms at [facility]. Today we recommend the following:  Orders Placed This Encounter  Procedures   Procedural/ Surgical Case Request: ROBOTIC ASSISTED NAVIGATIONAL BRONCHOSCOPY   NM PET SUPER D CT   CT Super D Chest Wo Contrast   Ambulatory referral to Pulmonology   Bronchoscopy on 10/19/2021  Return in about 25 days (around 10/26/2021) for with Eric Form, NP.    Please do your part to reduce the spread of COVID-19.

## 2021-10-01 NOTE — Addendum Note (Signed)
Addended by: Alvin Critchley on: 10/01/2021 05:44 PM   Modules accepted: Orders

## 2021-10-01 NOTE — H&P (View-Only) (Signed)
Synopsis: Referred in September 2023 for lung nodule by Deliah Goody, PA-C  Subjective:   PATIENT ID: [name]: female DOB: [birth year], MRN: [id number]  Chief Complaint  Patient presents with   Consult    Lung nodule    This is a 76 year old female, past medical history of COPD, bladder cancer, alcohol use. Patient had a CT scan of the chest on 09/02/2021 this revealed a spiculated 1 cm nodule in the right upper lobe concerning for malignancy.  Patient was referred here for consideration of next steps.  Patient has no respiratory complaints.  She is a longstanding history of tobacco use.  She has smoked for 50+ years since the age of 57.  She has no cough no fever no shortness of breath.  Denies hemoptysis.    Past Medical History:  Diagnosis Date   Bladder cancer ([place])    Lung nodule    Tobacco use      Family History  Problem Relation Age of Onset   Breast cancer Neg Hx      Past Surgical History:  Procedure Laterality Date   CYSTOSCOPY     patient thinks at [place] History   Socioeconomic History   Marital status: Divorced    Spouse name: Not on file   Number of children: Not on file   Years of education: Not on file   Highest education level: Not on file  Occupational History   Not on file  Tobacco Use   Smoking status: Every Day    Types: Cigarettes   Smokeless tobacco: Never   Tobacco comments:    Smokes 3 1/2 pack of cigarettes a week. 10/01/2021 [place]  Substance and Sexual Activity   Alcohol use: Not on file   Drug use: Not on file   Sexual activity: Not on file  Other Topics Concern   Not on file  Social History Narrative   ** Merged History Encounter **       Social Determinants of Health   Financial Resource Strain: Not on file  Food Insecurity: Not on file  Transportation Needs: Not on file  Physical Activity: Not on file  Stress: Not on file  Social Connections: Not on file  Intimate Partner Violence: Not on file      Not on File   Outpatient Medications Prior to Visit  Medication Sig Dispense Refill   tiZANidine (ZANAFLEX) 2 MG tablet Take by mouth.     ibuprofen (ADVIL) 800 MG tablet Take 800 mg by mouth 2 (two) times daily as needed.     mirtazapine (REMERON) 7.5 MG tablet SMARTSIG:1 Tablet(s) By Mouth Every Evening     pramipexole (MIRAPEX) 0.25 MG tablet Take 0.25 mg by mouth at bedtime.     No facility-administered medications prior to visit.    Review of Systems  Constitutional:  Negative for chills, fever, malaise/fatigue and weight loss.  HENT:  Negative for hearing loss, sore throat and tinnitus.   Eyes:  Negative for blurred vision and double vision.  Respiratory:  Negative for cough, hemoptysis, sputum production, shortness of breath, wheezing and stridor.   Cardiovascular:  Negative for chest pain, palpitations, orthopnea, leg swelling and PND.  Gastrointestinal:  Negative for abdominal pain, constipation, diarrhea, heartburn, nausea and vomiting.  Genitourinary:  Negative for dysuria, hematuria and urgency.  Musculoskeletal:  Negative for joint pain and myalgias.  Skin:  Negative for itching and rash.  Neurological:  Negative for dizziness, tingling, weakness and headaches.  Endo/Heme/Allergies:  Negative for environmental allergies. Does not bruise/bleed easily.  Psychiatric/Behavioral:  Negative for depression. The patient is not nervous/anxious and does not have insomnia.   All other systems reviewed and are negative.    Objective:  Physical Exam Vitals reviewed.  Constitutional:      General: She is not in acute distress.    Appearance: She is well-developed.     Comments: Thin elderly female  HENT:     Head: Normocephalic and atraumatic.  Eyes:     General: No scleral icterus.    Conjunctiva/sclera: Conjunctivae normal.     Pupils: Pupils are equal, round, and reactive to light.  Neck:     Vascular: No JVD.     Trachea: No tracheal deviation.  Cardiovascular:      Rate and Rhythm: Normal rate and regular rhythm.     Heart sounds: Normal heart sounds. No murmur heard. Pulmonary:     Effort: Pulmonary effort is normal. No tachypnea, accessory muscle usage or respiratory distress.     Breath sounds: No stridor. No wheezing, rhonchi or rales.  Abdominal:     General: There is no distension.     Palpations: Abdomen is soft.     Tenderness: There is no abdominal tenderness.  Musculoskeletal:        General: No tenderness.     Cervical back: Neck supple.  Lymphadenopathy:     Cervical: No cervical adenopathy.  Skin:    General: Skin is warm and dry.     Capillary Refill: Capillary refill takes less than 2 seconds.     Findings: No rash.  Neurological:     Mental Status: She is alert and oriented to person, place, and time.  Psychiatric:        Behavior: Behavior normal.      Vitals:   10/01/21 1516  BP: (!) 110/50  Pulse: 75  SpO2: 97%  Weight: 98 lb 12.8 oz (44.8 kg)  Height: 5\' 6"  (1.676 m)   97% on RA BMI Readings from Last 3 Encounters:  10/01/21 15.95 kg/m   Wt Readings from Last 3 Encounters:  10/01/21 98 lb 12.8 oz (44.8 kg)     CBC    Component Value Date/Time   WBC 2.8 (L) 11/13/2007 2326   RBC 4.11 11/13/2007 2326   HGB 13.3 11/13/2007 2326   HCT 41.4 11/13/2007 2326   PLT 144 (L) 11/13/2007 2326   MCV 100.7 (H) 11/13/2007 2326   MCHC 32.1 11/13/2007 2326   RDW 13.1 11/13/2007 2326   LYMPHSABS 1.9 11/13/2007 2326   MONOABS 0.2 11/13/2007 2326   EOSABS 0.1 11/13/2007 2326   BASOSABS 0.0 11/13/2007 2326    Chest Imaging: 09/02/2021 CT chest: 1 cm right upper lobe spiculated pulmonary nodule concerning for malignancy. The patient's images have been independently reviewed by me.    Pulmonary Functions Testing Results:     No data to display          FeNO:   Pathology:   Echocardiogram:   Heart Catheterization:     Assessment & Plan:     ICD-10-CM   1. Lung nodule  R91.1 NM PET SUPER D CT    CT  Super D Chest Wo Contrast    Procedural/ Surgical Case Request: ROBOTIC ASSISTED NAVIGATIONAL BRONCHOSCOPY    Ambulatory referral to Pulmonology    2. Tobacco use  Z72.0       Discussion:   This is a 76 year old female, longstanding history of tobacco use  found to have a 1 cm spiculated pulmonary nodule concerning for malignancy.  This was found incidentally on CT imaging.  Patient has a longstanding history of tobacco use and a high probability of this being malignancy.  Plan: Today in the office we talked about the risk benefits and alternatives of considering bronchoscopy and biopsy. I think that the next best step to be consideration for PET scan followed by bronchoscopy. This particular case I think it would be great if we can get the PET scan before biopsy. I have ordered also to have a PET super D CT. We talked about the risk benefits and alternatives of proceeding with bronchoscopy and biopsy. We talked the risk of bleeding and pneumothorax. Patient is agreeable to proceed. Tentative bronchoscopy date will be on 10/19/2021.    Current Outpatient Medications:    tiZANidine (ZANAFLEX) 2 MG tablet, Take by mouth., Disp: , Rfl:    ibuprofen (ADVIL) 800 MG tablet, Take 800 mg by mouth 2 (two) times daily as needed., Disp: , Rfl:    mirtazapine (REMERON) 7.5 MG tablet, SMARTSIG:1 Tablet(s) By Mouth Every Evening, Disp: , Rfl:    pramipexole (MIRAPEX) 0.25 MG tablet, Take 0.25 mg by mouth at bedtime., Disp: , Rfl:   I spent 63 minutes dedicated to the care of this patient on the date of this encounter to include pre-visit review of records, face-to-face time with the patient discussing conditions above, post visit ordering of testing, clinical documentation with the electronic health record, making appropriate referrals as documented, and communicating necessary findings to members of the patients care team.   Garner Nash, DO [facility] 10/01/2021 3:49 PM

## 2021-10-08 ENCOUNTER — Other Ambulatory Visit: Payer: Self-pay | Admitting: *Deleted

## 2021-10-08 DIAGNOSIS — R911 Solitary pulmonary nodule: Secondary | ICD-10-CM

## 2021-10-08 NOTE — Progress Notes (Deleted)
error 

## 2021-10-08 NOTE — Addendum Note (Signed)
Addended by: Lorretta Harp on: 10/08/2021 09:39 AM   Modules accepted: Orders

## 2021-10-11 ENCOUNTER — Other Ambulatory Visit: Payer: Self-pay

## 2021-10-11 ENCOUNTER — Encounter (HOSPITAL_COMMUNITY): Payer: Self-pay | Admitting: Pulmonary Disease

## 2021-10-12 ENCOUNTER — Encounter (HOSPITAL_COMMUNITY): Payer: Self-pay | Admitting: Pulmonary Disease

## 2021-10-12 ENCOUNTER — Encounter (HOSPITAL_COMMUNITY)
Admission: RE | Disposition: A | Discharge status: Home / Self Care | Payer: Self-pay | Source: Home / Self Care | Attending: Pulmonary Disease

## 2021-10-12 ENCOUNTER — Other Ambulatory Visit: Payer: Self-pay

## 2021-10-12 ENCOUNTER — Ambulatory Visit (HOSPITAL_COMMUNITY): Payer: Medicare HMO

## 2021-10-12 ENCOUNTER — Ambulatory Visit (HOSPITAL_BASED_OUTPATIENT_CLINIC_OR_DEPARTMENT_OTHER): Payer: Medicare HMO | Admitting: Certified Registered Nurse Anesthetist

## 2021-10-12 ENCOUNTER — Ambulatory Visit (HOSPITAL_COMMUNITY): Payer: Medicare HMO | Admitting: Certified Registered Nurse Anesthetist

## 2021-10-12 ENCOUNTER — Ambulatory Visit (HOSPITAL_COMMUNITY)
Admission: RE | Admit: 2021-10-12 | Discharge: 2021-10-12 | Disposition: A | Discharge status: Home / Self Care | Payer: Medicare HMO | Attending: Pulmonary Disease | Admitting: Pulmonary Disease

## 2021-10-12 DIAGNOSIS — R911 Solitary pulmonary nodule: Secondary | ICD-10-CM | POA: Diagnosis present

## 2021-10-12 DIAGNOSIS — Z8551 Personal history of malignant neoplasm of bladder: Secondary | ICD-10-CM | POA: Diagnosis not present

## 2021-10-12 DIAGNOSIS — F17210 Nicotine dependence, cigarettes, uncomplicated: Secondary | ICD-10-CM

## 2021-10-12 DIAGNOSIS — J449 Chronic obstructive pulmonary disease, unspecified: Secondary | ICD-10-CM

## 2021-10-12 DIAGNOSIS — Z01818 Encounter for other preprocedural examination: Secondary | ICD-10-CM | POA: Diagnosis not present

## 2021-10-12 DIAGNOSIS — C3411 Malignant neoplasm of upper lobe, right bronchus or lung: Secondary | ICD-10-CM | POA: Insufficient documentation

## 2021-10-12 DIAGNOSIS — J432 Centrilobular emphysema: Secondary | ICD-10-CM | POA: Diagnosis not present

## 2021-10-12 DIAGNOSIS — Z20822 Contact with and (suspected) exposure to covid-19: Secondary | ICD-10-CM | POA: Insufficient documentation

## 2021-10-12 DIAGNOSIS — F17200 Nicotine dependence, unspecified, uncomplicated: Secondary | ICD-10-CM | POA: Diagnosis not present

## 2021-10-12 DIAGNOSIS — R918 Other nonspecific abnormal finding of lung field: Secondary | ICD-10-CM | POA: Diagnosis not present

## 2021-10-12 HISTORY — PX: FIDUCIAL MARKER PLACEMENT: SHX6858

## 2021-10-12 HISTORY — PX: BRONCHIAL NEEDLE ASPIRATION BIOPSY: SHX5106

## 2021-10-12 HISTORY — PX: BRONCHIAL BIOPSY: SHX5109

## 2021-10-12 LAB — CBC
HCT: 37.8 % (ref 36.0–46.0)
Hemoglobin: 12.8 g/dL (ref 12.0–15.0)
MCH: 33.8 pg (ref 26.0–34.0)
MCHC: 33.9 g/dL (ref 30.0–36.0)
MCV: 99.7 fL (ref 80.0–100.0)
Platelets: 234 10*3/uL (ref 150–400)
RBC: 3.79 MIL/uL — ABNORMAL LOW (ref 3.87–5.11)
RDW: 14.1 % (ref 11.5–15.5)
WBC: 3 10*3/uL — ABNORMAL LOW (ref 4.0–10.5)
nRBC: 0 % (ref 0.0–0.2)

## 2021-10-12 LAB — SARS CORONAVIRUS 2 BY RT PCR: SARS Coronavirus 2 by RT PCR: NEGATIVE

## 2021-10-12 SURGERY — BRONCHOSCOPY, WITH BIOPSY USING ELECTROMAGNETIC NAVIGATION
Anesthesia: General | Laterality: Bilateral

## 2021-10-12 MED ORDER — ACETAMINOPHEN 10 MG/ML IV SOLN: 1000.0000 mg | Freq: Once | INTRAVENOUS | Status: DC | PRN

## 2021-10-12 MED ORDER — PROPOFOL 10 MG/ML IV BOLUS
INTRAVENOUS | Status: DC | PRN
  Administered 2021-10-12: 110 mg via INTRAVENOUS

## 2021-10-12 MED ORDER — CHLORHEXIDINE GLUCONATE 0.12 % MT SOLN
OROMUCOSAL | Status: AC
  Administered 2021-10-12: 15 mL via OROMUCOSAL
  Filled 2021-10-12: qty 15

## 2021-10-12 MED ORDER — PROPOFOL 500 MG/50ML IV EMUL
INTRAVENOUS | Status: DC | PRN
  Administered 2021-10-12: 125 ug/kg/min via INTRAVENOUS

## 2021-10-12 MED ORDER — SUGAMMADEX SODIUM 200 MG/2ML IV SOLN
INTRAVENOUS | Status: DC | PRN
  Administered 2021-10-12: 200 mg via INTRAVENOUS

## 2021-10-12 MED ORDER — CHLORHEXIDINE GLUCONATE 0.12 % MT SOLN: 15.0000 mL | Freq: Once | OROMUCOSAL | Status: AC

## 2021-10-12 MED ORDER — LIDOCAINE 2% (20 MG/ML) 5 ML SYRINGE
INTRAMUSCULAR | Status: DC | PRN
  Administered 2021-10-12: 40 mg via INTRAVENOUS

## 2021-10-12 MED ORDER — CHLORHEXIDINE GLUCONATE 0.12% ORAL RINSE (MEDLINE KIT): 15.0000 mL | Freq: Once | OROMUCOSAL | Status: DC

## 2021-10-12 MED ORDER — PHENYLEPHRINE HCL-NACL 20-0.9 MG/250ML-% IV SOLN
INTRAVENOUS | Status: DC | PRN
  Administered 2021-10-12: 30 ug/min via INTRAVENOUS

## 2021-10-12 MED ORDER — FENTANYL CITRATE (PF) 100 MCG/2ML IJ SOLN: 25.0000 ug | INTRAMUSCULAR | Status: DC | PRN

## 2021-10-12 MED ORDER — ROCURONIUM BROMIDE 10 MG/ML (PF) SYRINGE
PREFILLED_SYRINGE | INTRAVENOUS | Status: DC | PRN
  Administered 2021-10-12: 50 mg via INTRAVENOUS

## 2021-10-12 MED ORDER — ONDANSETRON HCL 4 MG/2ML IJ SOLN
INTRAMUSCULAR | Status: DC | PRN
  Administered 2021-10-12: 4 mg via INTRAVENOUS

## 2021-10-12 MED ORDER — LACTATED RINGERS IV SOLN: INTRAVENOUS | Status: DC

## 2021-10-12 SURGICAL SUPPLY — 1 items: superlock fiducial marker IMPLANT

## 2021-10-12 NOTE — Anesthesia Procedure Notes (Signed)
Procedure Name: Intubation Date/Time: 10/12/2021 11:35 AM  Performed by: Valda Favia, CRNAPre-anesthesia Checklist: Patient identified, Emergency Drugs available, Suction available and Patient being monitored Patient Re-evaluated:Patient Re-evaluated prior to induction Oxygen Delivery Method: Circle System Utilized Preoxygenation: Pre-oxygenation with 100% oxygen Induction Type: IV induction Ventilation: Mask ventilation without difficulty Laryngoscope Size: Mac and 4 Tube type: Oral Tube size: 8.5 mm Number of attempts: 1 Airway Equipment and Method: Stylet and Oral airway Placement Confirmation: ETT inserted through vocal cords under direct vision, positive ETCO2 and breath sounds checked- equal and bilateral Secured at: 21 cm Tube secured with: Tape Dental Injury: Teeth and Oropharynx as per pre-operative assessment

## 2021-10-12 NOTE — Transfer of Care (Signed)
Immediate Anesthesia Transfer of Care Note  Patient: MARETTA OVERDORF  Procedure(s) Performed: ROBOTIC ASSISTED NAVIGATIONAL BRONCHOSCOPY (Bilateral) BRONCHIAL BIOPSIES BRONCHIAL NEEDLE ASPIRATION BIOPSIES FIDUCIAL MARKER PLACEMENT  Patient Location: PACU  Anesthesia Type:General  Level of Consciousness: awake and alert   Airway & Oxygen Therapy: Patient Spontanous Breathing and Patient connected to nasal cannula oxygen  Post-op Assessment: Report given to RN and Post -op Vital signs reviewed and stable  Post vital signs: Reviewed and stable  Last Vitals:  Vitals Value Taken Time  BP    Temp    Pulse 81 10/12/21 1227  Resp 19 10/12/21 1227  SpO2 100 % 10/12/21 1227  Vitals shown include unvalidated device data.  Last Pain:  Vitals:   10/12/21 0839  PainSc: 0-No pain         Complications: No notable events documented.

## 2021-10-12 NOTE — Anesthesia Preprocedure Evaluation (Signed)
Anesthesia Evaluation  Patient identified by MRN, date of birth, ID band Patient awake    Reviewed: Allergy & Precautions, NPO status , Patient's Chart, lab work & pertinent test results  Airway Mallampati: II  TM Distance: >3 FB Neck ROM: Full    Dental  (+) Edentulous Upper, Edentulous Lower   Pulmonary COPD, Current Smoker and Patient abstained from smoking.,  Lung nodule   Pulmonary exam normal        Cardiovascular negative cardio ROS   Rhythm:Regular Rate:Normal     Neuro/Psych negative neurological ROS  negative psych ROS   GI/Hepatic negative GI ROS, Neg liver ROS,   Endo/Other  negative endocrine ROS  Renal/GU negative Renal ROS  negative genitourinary   Musculoskeletal negative musculoskeletal ROS (+)   Abdominal Normal abdominal exam  (+)   Peds  Hematology negative hematology ROS (+)   Anesthesia Other Findings   Reproductive/Obstetrics                             Anesthesia Physical Anesthesia Plan  ASA: 3  Anesthesia Plan: General   Post-op Pain Management:    Induction: Intravenous  PONV Risk Score and Plan: 2 and Ondansetron, Dexamethasone and Treatment may vary due to age or medical condition  Airway Management Planned: Mask and Oral ETT  Additional Equipment: None  Intra-op Plan:   Post-operative Plan: Extubation in OR  Informed Consent: I have reviewed the patients History and Physical, chart, labs and discussed the procedure including the risks, benefits and alternatives for the proposed anesthesia with the patient or authorized representative who has indicated his/her understanding and acceptance.     Dental advisory given  Plan Discussed with: CRNA  Anesthesia Plan Comments: (Lab Results      Component                Value               Date                      WBC                      3.0 (L)             10/12/2021                HGB                       12.8                10/12/2021                HCT                      37.8                10/12/2021                MCV                      99.7                10/12/2021                PLT  234                 10/12/2021           Lab Results      Component                Value               Date                      NA                       139                 09/12/2007                K                        3.7                 09/12/2007                CO2                      22                  09/12/2007                GLUCOSE                  114 (H)             09/12/2007                BUN                      5 (L)               09/12/2007                CREATININE               0.56                09/12/2007                CALCIUM                  9.0                 09/12/2007          )        Anesthesia Quick Evaluation

## 2021-10-12 NOTE — Interval H&P Note (Signed)
History and Physical Interval Note:  10/12/2021 8:57 AM  [name]  has presented today for surgery, with the diagnosis of lung nodule.  The various methods of treatment have been discussed with the patient and family. After consideration of risks, benefits and other options for treatment, the patient has consented to  Procedure(s) with comments: ROBOTIC ASSISTED NAVIGATIONAL BRONCHOSCOPY (Bilateral) - Ion w/ CIOS as a surgical intervention.  The patient's history has been reviewed, patient examined, no change in status, stable for surgery.  I have reviewed the patient's chart and labs.  Questions were answered to the patient's satisfaction.     [place]

## 2021-10-12 NOTE — Anesthesia Postprocedure Evaluation (Signed)
Anesthesia Post Note  Patient: [name]  Procedure(s) Performed: ROBOTIC ASSISTED NAVIGATIONAL BRONCHOSCOPY (Bilateral) BRONCHIAL BIOPSIES BRONCHIAL NEEDLE ASPIRATION BIOPSIES FIDUCIAL MARKER PLACEMENT     Patient location during evaluation: PACU Anesthesia Type: General Level of consciousness: awake and alert Pain management: pain level controlled Vital Signs Assessment: post-procedure vital signs reviewed and stable Respiratory status: spontaneous breathing, nonlabored ventilation, respiratory function stable and patient connected to nasal cannula oxygen Cardiovascular status: blood pressure returned to baseline and stable Postop Assessment: no apparent nausea or vomiting Anesthetic complications: no   No notable events documented.  Last Vitals:  Vitals:   10/12/21 1245 10/12/21 1300  BP: 118/61 110/67  Pulse: 83 69  Resp: 19 19  Temp:  36.5 C  SpO2: 100% 100%    Last Pain:  Vitals:   10/12/21 1225  PainSc: 0-No pain                 Belenda Cruise P Baldomero Mirarchi

## 2021-10-12 NOTE — Op Note (Signed)
Video Bronchoscopy with Robotic Assisted Bronchoscopic Navigation   Date of Operation: 10/12/2021   Pre-op Diagnosis: Right upper lobe lung nodule  Post-op Diagnosis: Right upper lobe lung nodule  Surgeon: Garner Nash, DO  Assistants: None   Anesthesia: General endotracheal anesthesia  Operation: Flexible video fiberoptic bronchoscopy with robotic assistance and biopsies.  Estimated Blood Loss: Minimal  Complications: None  Indications and History: [name] is a 76 y.o. female with history of RUL lung nodule. The risks, benefits, complications, treatment options and expected outcomes were discussed with the patient.  The possibilities of pneumothorax, pneumonia, reaction to medication, pulmonary aspiration, perforation of a viscus, bleeding, failure to diagnose a condition and creating a complication requiring transfusion or operation were discussed with the patient who freely signed the consent.    Description of Procedure: The patient was seen in the Preoperative Area, was examined and was deemed appropriate to proceed.  The patient was taken to [facility] endoscopy room 3, identified as [name] and the procedure verified as Flexible Video Fiberoptic Bronchoscopy.  A Time Out was held and the above information confirmed.   Prior to the date of the procedure a high-resolution CT scan of the chest was performed. Utilizing ION software program a virtual tracheobronchial tree was generated to allow the creation of distinct navigation pathways to the patient's parenchymal abnormalities. After being taken to the operating room general anesthesia was initiated and the patient  was orally intubated. The video fiberoptic bronchoscope was introduced via the endotracheal tube and a general inspection was performed which showed normal right and left lung anatomy, aspiration of the bilateral mainstems was completed to remove any remaining secretions. Robotic catheter inserted into patient's  endotracheal tube.   Target #1 RUL lung nodule: The distinct navigation pathways prepared prior to this procedure were then utilized to navigate to patient's lesion identified on CT scan. The robotic catheter was secured into place and the vision probe was withdrawn.  Lesion location was approximated using fluoroscopy, 3D CBCT imaging and radial endobronchial ultrasound for peripheral targeting. Under fluoroscopic guidance transbronchial needle biopsies, and transbronchial forceps biopsies were performed to be sent for cytology and pathology. Following biopsies a single fiducial was placed using the fiducial catheter wire and delivery kit.   At the end of the procedure a general airway inspection was performed and there was no evidence of active bleeding. The bronchoscope was removed.  The patient tolerated the procedure well. There was no significant blood loss and there were no obvious complications. A post-procedural chest x-ray is pending.  Samples Target #1: 1. Transbronchial Wang needle biopsies from RUL 2. Transbronchial forceps biopsies from RUL  Plans:  The patient will be discharged from the PACU to home when recovered from anesthesia and after chest x-ray is reviewed. We will review the cytology, pathology results with the patient when they become available. Outpatient followup will be with Stephanie Plowman L Derek Laughter,DO.  [facility] 10/12/2021 12:32 PM

## 2021-10-12 NOTE — Discharge Instructions (Signed)
Flexible Bronchoscopy, Care After This sheet gives you information about how to care for yourself after your test. Your doctor may also give you more specific instructions. If you have problems or questions, contact your doctor. Follow these instructions at home: Eating and drinking Do not eat or drink anything (not even water) for 2 hours after your test, or until your numbing medicine (local anesthetic) wears off. When your numbness is gone and your cough and gag reflexes have come back, you may: Eat only soft foods. Slowly drink liquids. The day after the test, go back to your normal diet. Driving Do not drive for 24 hours if you were given a medicine to help you relax (sedative). Do not drive or use heavy machinery while taking prescription pain medicine. General instructions  Take over-the-counter and prescription medicines only as told by your doctor. Return to your normal activities as told. Ask what activities are safe for you. Do not use any products that have nicotine or tobacco in them. This includes cigarettes and e-cigarettes. If you need help quitting, ask your doctor. Keep all follow-up visits as told by your doctor. This is important. It is very important if you had a tissue sample (biopsy) taken. Get help right away if: You have shortness of breath that gets worse. You get light-headed. You feel like you are going to pass out (faint). You have chest pain. You cough up: More than a little blood. More blood than before. Summary Do not eat or drink anything (not even water) for 2 hours after your test, or until your numbing medicine wears off. Do not use cigarettes. Do not use e-cigarettes. Get help right away if you have chest pain.  This information is not intended to replace advice given to you by your health care provider. Make sure you discuss any questions you have with your health care provider. Document Released: 11/14/2008 Document Revised: 12/30/2016 Document  Reviewed: 02/05/2016 Elsevier Patient Education  2020 [employer].

## 2021-10-14 ENCOUNTER — Encounter (HOSPITAL_COMMUNITY): Payer: Self-pay | Admitting: Pulmonary Disease

## 2021-10-14 LAB — CYTOLOGY - NON PAP

## 2021-10-18 ENCOUNTER — Other Ambulatory Visit (HOSPITAL_COMMUNITY): Payer: Medicare HMO

## 2021-10-18 NOTE — Progress Notes (Signed)
[name],   Patient has an appointment with [name] this week to review pathology results.  Consistent with squamous cell carcinoma of the lung.  She will need a referral to radiation oncology.  Thanks,  BLI  Garner Nash, DO [facility] 10/18/2021 3:48 PM

## 2021-10-19 ENCOUNTER — Ambulatory Visit: Payer: Medicare HMO | Admitting: Adult Health

## 2021-10-19 DIAGNOSIS — R911 Solitary pulmonary nodule: Secondary | ICD-10-CM | POA: Insufficient documentation

## 2021-10-21 ENCOUNTER — Other Ambulatory Visit: Payer: Self-pay | Admitting: *Deleted

## 2021-10-21 ENCOUNTER — Telehealth: Payer: Self-pay | Admitting: Emergency Medicine

## 2021-10-21 NOTE — Progress Notes (Signed)
The proposed treatment discussed in conference is for discussion purpose only and is not a binding recommendation.  The patients have not been physically examined, or presented with their treatment options.  Therefore, final treatment plans cannot be decided.  

## 2021-10-22 ENCOUNTER — Encounter: Payer: Self-pay | Admitting: Adult Health

## 2021-10-22 ENCOUNTER — Other Ambulatory Visit: Payer: Self-pay | Admitting: Adult Health

## 2021-10-22 ENCOUNTER — Ambulatory Visit (INDEPENDENT_AMBULATORY_CARE_PROVIDER_SITE_OTHER): Payer: Medicare HMO | Admitting: Adult Health

## 2021-10-22 ENCOUNTER — Ambulatory Visit: Payer: Medicare HMO | Admitting: Adult Health

## 2021-10-22 ENCOUNTER — Ambulatory Visit (INDEPENDENT_AMBULATORY_CARE_PROVIDER_SITE_OTHER): Payer: Medicare HMO

## 2021-10-22 VITALS — BP 100/56 | HR 99 | Temp 98.0°F | Ht 66.0 in | Wt 93.4 lb

## 2021-10-22 DIAGNOSIS — C3491 Malignant neoplasm of unspecified part of right bronchus or lung: Secondary | ICD-10-CM | POA: Diagnosis not present

## 2021-10-22 DIAGNOSIS — J449 Chronic obstructive pulmonary disease, unspecified: Secondary | ICD-10-CM | POA: Diagnosis not present

## 2021-10-22 DIAGNOSIS — R911 Solitary pulmonary nodule: Secondary | ICD-10-CM

## 2021-10-22 DIAGNOSIS — J441 Chronic obstructive pulmonary disease with (acute) exacerbation: Secondary | ICD-10-CM

## 2021-10-22 DIAGNOSIS — C3490 Malignant neoplasm of unspecified part of unspecified bronchus or lung: Secondary | ICD-10-CM | POA: Insufficient documentation

## 2021-10-22 DIAGNOSIS — R059 Cough, unspecified: Secondary | ICD-10-CM | POA: Diagnosis not present

## 2021-10-22 LAB — POC COVID19 BINAXNOW: SARS Coronavirus 2 Ag: NEGATIVE

## 2021-10-22 LAB — POCT INFLUENZA A/B: Influenza B, POC: NEGATIVE

## 2021-10-22 MED ORDER — AMOXICILLIN-POT CLAVULANATE 875-125 MG PO TABS: 1.0000 | ORAL_TABLET | Freq: Two times a day (BID) | ORAL | 0 refills | Status: DC

## 2021-10-22 MED ORDER — STIOLTO RESPIMAT 2.5-2.5 MCG/ACT IN AERS: 2.0000 | INHALATION_SPRAY | Freq: Every day | RESPIRATORY_TRACT | 0 refills | Status: DC

## 2021-10-22 MED ORDER — STIOLTO RESPIMAT 2.5-2.5 MCG/ACT IN AERS: 2.0000 | INHALATION_SPRAY | Freq: Every day | RESPIRATORY_TRACT | 5 refills | Status: AC

## 2021-10-22 MED ORDER — ALBUTEROL SULFATE (2.5 MG/3ML) 0.083% IN NEBU
2.5000 mg | INHALATION_SOLUTION | Freq: Once | RESPIRATORY_TRACT | Status: AC
  Administered 2021-10-22: 2.5 mg via RESPIRATORY_TRACT

## 2021-10-22 MED ORDER — PREDNISONE 10 MG PO TABS: ORAL_TABLET | ORAL | 0 refills | Status: DC

## 2021-10-22 MED ORDER — METHYLPREDNISOLONE ACETATE 80 MG/ML IJ SUSP
80.0000 mg | Freq: Once | INTRAMUSCULAR | Status: AC
  Administered 2021-10-22: 80 mg via INTRAMUSCULAR

## 2021-10-22 MED ORDER — ALBUTEROL SULFATE HFA 108 (90 BASE) MCG/ACT IN AERS: 1.0000 | INHALATION_SPRAY | Freq: Four times a day (QID) | RESPIRATORY_TRACT | 2 refills | Status: DC | PRN

## 2021-10-22 NOTE — Patient Instructions (Signed)
Augmentin 875 mg twice daily x1 week, take with food Prednisone taper over the next week Mucinex DM twice daily as needed for cough and congestion Begin Stiolto 2 puffs daily Albuterol inhaler as needed Refer to radiation oncology for lung cancer.  Follow up with Dr. Valeta Harms in 2 weeks and As needed   Please contact office for sooner follow up if symptoms do not improve or worsen or seek emergency care

## 2021-10-22 NOTE — Addendum Note (Signed)
Addended by: Vanessa Barbara on: 10/22/2021 04:24 PM   Modules accepted: Orders

## 2021-10-22 NOTE — Telephone Encounter (Signed)
Called and spoke with pt's daughter stating to her that if pt has not been feeling well since the bronch that it would probably be best for pt to be seen for evaluation and she verbalized understanding. We did reschedule pt's appt as it had been cancelled prior. Stated to Ivin Booty to make sure that pt is wearing a mask and she verbalized understanding. Nothing further needed.

## 2021-10-22 NOTE — Assessment & Plan Note (Signed)
Newly diagnosed right upper lobe lung cancer.  Underwent navigational bronchoscopy and October 12, 2021 with cytology positive for non-small cell carcinoma.  CT chest with 1 cm spiculated right upper lobe nodule no evidence of distant disease.  Case was discussed at tumor board.  Patient's been recommended for referral to radiation oncology for consideration of SBRT. We will discuss if PET and MRI brain are indicated going forward. Discussed test results in detail with patient and her daughter.  All questions were answered.  Support was provided.  Plan  Patient Instructions  Augmentin 875 mg twice daily x1 week, take with food Prednisone taper over the next week Mucinex DM twice daily as needed for cough and congestion Begin Stiolto 2 puffs daily Albuterol inhaler as needed Refer to radiation oncology for lung cancer.  Follow up with Dr. Valeta Harms in 2 weeks and As needed   Please contact office for sooner follow up if symptoms do not improve or worsen or seek emergency care

## 2021-10-22 NOTE — Progress Notes (Signed)
@Patient  ID: [name], female    DOB: 23-Jul-1945, 76 y.o.   MRN: [id number]  Chief Complaint  Patient presents with   Follow-up    Referring provider: Nolene Ebbs, MD  HPI: 76 year old female seen for pulmonary consult October 01, 2021 for suspicious lung nodule right upper lobe Medical history significant bladder cancer and alcohol abuse.  TEST/EVENTS :   10/22/2021 Acute OV : COPD  Patient presents for an acute office visit.  Patient was seen for pulmonary consult 7 first 2023 for suspicious right upper lobe lung nodule.  CT chest showed a 1 cm spiculated pulmonary nodule concerning for malignancy.  She underwent navigational bronchoscopy on October 12, 2021, cytology was positive for non-small cell carcinoma consistent with squamous cell.  We reviewed her cytology results in detail. With patient's comorbidities she has been recommended for referral for radiation oncology for consideration of SBRT.   Patient complains over the last week since her bronchoscopy that she has had increased cough, congestion weakness and shortness of breath.  Chest x-ray today shows chronic changes with no acute process noted.  COVID-19 and influenza office test is negative.Marland Kitchen Appetite has been very low.  She says she is eating but does not have much taste for anything.  No Known Allergies  Immunization History  Administered Date(s) Administered   Influenza Whole 11/27/2006   Pneumococcal Polysaccharide-23 03/31/2005   Td 03/31/2005    Past Medical History:  Diagnosis Date   Bladder cancer ([place])    Lung nodule    Tobacco use     Tobacco History: Social History   Tobacco Use  Smoking Status Every Day   Types: Cigarettes  Smokeless Tobacco Never  Tobacco Comments   Smoking every now and then.  Not smoking every day since the bronch. She is trying to quit.  10/22/2021 hfb   Ready to quit: Not Answered Counseling given: Not Answered Tobacco comments: Smoking every now and then.   Not smoking every day since the bronch. She is trying to quit.  10/22/2021 hfb   Outpatient Medications Prior to Visit  Medication Sig Dispense Refill   ibuprofen (ADVIL) 800 MG tablet Take 800 mg by mouth 2 (two) times daily as needed.     mirtazapine (REMERON) 7.5 MG tablet SMARTSIG:1 Tablet(s) By Mouth Every Evening     Multiple Vitamin (MULTIVITAMIN) tablet Take 1 tablet by mouth daily.     pramipexole (MIRAPEX) 0.25 MG tablet Take 0.25 mg by mouth at bedtime.     tiZANidine (ZANAFLEX) 2 MG tablet Take by mouth.     No facility-administered medications prior to visit.     Review of Systems:   Constitutional:   No  weight loss, night sweats,  Fevers, chills,  +fatigue, or  lassitude.  HEENT:   No headaches,  Difficulty swallowing,  Tooth/dental problems, or  Sore throat,                No sneezing, itching, ear ache,  +nasal congestion, post nasal drip,   CV:  No chest pain,  Orthopnea, PND, swelling in lower extremities, anasarca, dizziness, palpitations, syncope.   GI  No heartburn, indigestion, abdominal pain, nausea, vomiting, diarrhea, change in bowel habits, loss of appetite, bloody stools.   Resp:  No chest wall deformity  Skin: no rash or lesions.  GU: no dysuria, change in color of urine, no urgency or frequency.  No flank pain, no hematuria   MS:  No joint pain or swelling.  No decreased  range of motion.  No back pain.    Physical Exam  BP (!) 100/56 (BP Location: Left Arm, Patient Position: Sitting, Cuff Size: Normal)   Pulse 99   Temp 98 F (36.7 C) (Oral)   Ht 5\' 6"  (1.676 m)   Wt 93 lb 6.4 oz (42.4 kg)   SpO2 96%   BMI 15.08 kg/m   GEN: A/Ox3; pleasant , NAD, thin and frail    HEENT:  [place]/AT,  NOSE-clear, THROAT-clear, no lesions, no postnasal drip or exudate noted.   NECK:  Supple w/ fair ROM; no JVD; normal carotid impulses w/o bruits; no thyromegaly or nodules palpated; no lymphadenopathy.    RESP  scattered rhonchi   no accessory muscle use,  no dullness to percussion  CARD:  RRR, no m/r/g, no peripheral edema, pulses intact, no cyanosis or clubbing.  GI:   Soft & nt; nml bowel sounds; no organomegaly or masses detected.   Musco: Warm bil, no deformities or joint swelling noted.   Neuro: alert, no focal deficits noted.    Skin: Warm, no lesions or rashes    Lab Results:   BNP No results found for: "BNP"  ProBNP No results found for: "PROBNP"  Imaging: DG Chest 2 View  Result Date: 10/22/2021 CLINICAL DATA:  Cough and congestion post bronchoscopy. EXAM: CHEST - 2 VIEW COMPARISON:  Chest x-ray dated 10/12/2021. FINDINGS: Heart size and mediastinal contours are stable. Lungs are hyperexpanded. RIGHT upper lobe nodule is better visualized on the recent chest CT of 10/12/2021. No new lung findings. No pleural effusion or pneumothorax is seen. IMPRESSION: 1. No acute findings. No pleural effusion or pneumothorax is seen status post bronchoscopy. 2. RIGHT upper lobe spiculated pulmonary nodule is better visualized on recent chest CT of 10/12/2021. 3. COPD. Electronically Signed   By: Franki Cabot M.D.   On: 10/22/2021 15:14   DG Chest Port 1 View  Result Date: 10/12/2021 CLINICAL DATA:  Follow-up bronchoscopy EXAM: PORTABLE CHEST 1 VIEW COMPARISON:  09/13/2007 FINDINGS: Heart size is normal. Chronic tortuous aorta. Clip marker in the right upper to mid lung. Minimal hazy opacity in that region consistent with the presence of a nodule in subsequent biopsy. No advanced finding. No pneumothorax or hemothorax. IMPRESSION: Clip marker in the right mid to upper lung. Mild hazy opacity in that region consistent with recent bronchoscopy and biopsy. No pneumothorax. Electronically Signed   By: Nelson Chimes M.D.   On: 10/12/2021 12:59   DG C-ARM BRONCHOSCOPY  Result Date: 10/12/2021 C-ARM BRONCHOSCOPY: Fluoroscopy was utilized by the requesting physician.  No radiographic interpretation.   CT Super D Chest Wo Contrast  Result Date:  10/12/2021 CLINICAL DATA:  Pulmonary nodule, preoperative. * Tracking Code: BO * EXAM: CT CHEST WITHOUT CONTRAST TECHNIQUE: Multidetector CT imaging of the chest was performed using thin slice collimation for electromagnetic bronchoscopy planning purposes, without intravenous contrast. RADIATION DOSE REDUCTION: This exam was performed according to the departmental dose-optimization program which includes automated exposure control, adjustment of the mA and/or kV according to patient size and/or use of iterative reconstruction technique. COMPARISON:  09/02/2021 CT chest, abdomen and pelvis. FINDINGS: Cardiovascular: Normal heart size. No significant pericardial effusion/thickening. Three-vessel coronary atherosclerosis. Atherosclerotic nonaneurysmal thoracic aorta. Normal caliber pulmonary arteries. Mediastinum/Nodes: No discrete thyroid nodules. Unremarkable esophagus. No pathologically enlarged axillary, mediastinal or hilar lymph nodes, noting limited sensitivity for the detection of hilar adenopathy on this noncontrast study. Lungs/Pleura: No pneumothorax. No pleural effusion. Mild paraseptal and centrilobular emphysema. Spiculated 1.0 x 0.9  cm basilar right upper lobe solid pulmonary nodule (series 8/image 77), which abuts and slightly distorts the minor fissure, not appreciably changed. No acute consolidative airspace disease or additional significant pulmonary nodules. Upper abdomen: Left colonic diverticulosis. Musculoskeletal: No aggressive appearing focal osseous lesions. Moderate thoracic spondylosis. IMPRESSION: 1. Spiculated 1.0 cm basilar right upper lobe solid pulmonary nodule, not appreciably changed since 09/02/2021 chest CT, suspicious for primary bronchogenic carcinoma. This nodule abuts and slightly distorts the minor fissure. 2. No thoracic adenopathy or other findings of metastatic disease in the chest. 3. Three-vessel coronary atherosclerosis. 4. Left colonic diverticulosis. 5. Aortic  Atherosclerosis (ICD10-I70.0) and Emphysema (ICD10-J43.9). Electronically Signed   By: Ilona Sorrel M.D.   On: 10/12/2021 08:42          No data to display          No results found for: "NITRICOXIDE"      Assessment & Plan:   COPD with acute exacerbation [facility]) Patient has presumed COPD with history of heavy smoking-currently with acute exacerbation.  Treated with an albuterol nebulizer treatment office.  Depo-Medrol 80 mg IM injection in the office.  Begin Augmentin and prednisone taper.  Chest x-ray with no acute process noted. Advised patient if she is not improving she will need to return for sooner follow-up or seek emergency room care. COVID-19 test and influenza test in the office today negative We will begin Stiolto 2 puffs daily. Going forward we will need PFTs   Plan  Patient Instructions  Augmentin 875 mg twice daily x1 week, take with food Prednisone taper over the next week Mucinex DM twice daily as needed for cough and congestion Begin Stiolto 2 puffs daily Albuterol inhaler as needed Refer to radiation oncology for lung cancer.  Follow up with Dr. Valeta Harms in 2 weeks and As needed   Please contact office for sooner follow up if symptoms do not improve or worsen or seek emergency care         Lung cancer [facility]) Newly diagnosed right upper lobe lung cancer.  Underwent navigational bronchoscopy and October 12, 2021 with cytology positive for non-small cell carcinoma.  CT chest with 1 cm spiculated right upper lobe nodule no evidence of distant disease.  Case was discussed at tumor board.  Patient's been recommended for referral to radiation oncology for consideration of SBRT. We will discuss if PET and MRI brain are indicated going forward. Discussed test results in detail with patient and her daughter.  All questions were answered.  Support was provided.  Plan  Patient Instructions  Augmentin 875 mg twice daily x1 week, take with food Prednisone taper  over the next week Mucinex DM twice daily as needed for cough and congestion Begin Stiolto 2 puffs daily Albuterol inhaler as needed Refer to radiation oncology for lung cancer.  Follow up with Dr. Valeta Harms in 2 weeks and As needed   Please contact office for sooner follow up if symptoms do not improve or worsen or seek emergency care           Rexene Edison, NP 10/22/2021

## 2021-10-22 NOTE — Assessment & Plan Note (Signed)
Patient has presumed COPD with history of heavy smoking-currently with acute exacerbation.  Treated with an albuterol nebulizer treatment office.  Depo-Medrol 80 mg IM injection in the office.  Begin Augmentin and prednisone taper.  Chest x-ray with no acute process noted. Advised patient if she is not improving she will need to return for sooner follow-up or seek emergency room care. COVID-19 test and influenza test in the office today negative We will begin Stiolto 2 puffs daily. Going forward we will need PFTs   Plan  Patient Instructions  Augmentin 875 mg twice daily x1 week, take with food Prednisone taper over the next week Mucinex DM twice daily as needed for cough and congestion Begin Stiolto 2 puffs daily Albuterol inhaler as needed Refer to radiation oncology for lung cancer.  Follow up with Dr. Valeta Harms in 2 weeks and As needed   Please contact office for sooner follow up if symptoms do not improve or worsen or seek emergency care

## 2021-10-22 NOTE — Telephone Encounter (Signed)
Ivin Booty called back- please call at [phone].

## 2021-10-22 NOTE — Progress Notes (Signed)
Patient seen in the office today and instructed on use of Stiolto.  Patient expressed understanding and demonstrated technique. 

## 2021-10-22 NOTE — Progress Notes (Signed)
Patient states she had a flu shot last year and has had all the covid vaccines Therapist, music).

## 2021-10-22 NOTE — Addendum Note (Signed)
Addended by: Vanessa Barbara on: 10/22/2021 05:21 PM   Modules accepted: Orders

## 2021-10-22 NOTE — Telephone Encounter (Signed)
Called and spoke with Ivin Booty, pt's daughter, [place] per DPR  She states that the pt is having increased cough since bronch  She could not provide any other details  I called the pt [phone] and she did not answer- LMTCB

## 2021-10-25 ENCOUNTER — Telehealth: Payer: Self-pay | Admitting: Adult Health

## 2021-10-25 DIAGNOSIS — R911 Solitary pulmonary nodule: Secondary | ICD-10-CM

## 2021-10-25 DIAGNOSIS — C3491 Malignant neoplasm of unspecified part of right bronchus or lung: Secondary | ICD-10-CM

## 2021-10-25 NOTE — Telephone Encounter (Signed)
Changes Requested   ANORO ELLIPTA 62.5-25 MCG/ACT AEPB       Changed from: Tiotropium Bromide-Olodaterol (STIOLTO RESPIMAT) 2.5-2.5 MCG/ACT AERS   All pharmacy suggested alternatives are listed below   Sig: Inhale 2 puffs into the lungs daily.   Disp:  Not specified    Refills:  0   Start: 10/22/2021   Class: Normal   Last ordered: 3 days ago (10/22/2021) by Melvenia Needles, NP   Last refill: 10/22/2021   Rx #: [id number]   Pharmacy comment: Alternative Requested.  All Pharmacy Suggested Alternatives:   umeclidinium-vilanterol (ANORO ELLIPTA) 62.5-25 MCG/ACT AEPB Glycopyrrolate-Formoterol (BEVESPI AEROSPHERE) 9-4.8 MCG/ACT AERO  To prescribe one of the alternatives listed above, open the encounter and click Replace.  Open Encounter     To be filled at: [id number] - [place], [place], please advise on this.

## 2021-10-25 NOTE — Telephone Encounter (Signed)
Called and spoke with pt's daughter letting her know info per TP and she verbalized understanding. Order for PET has been placed. Nothing further needed.

## 2021-10-25 NOTE — Telephone Encounter (Signed)
Discussed at office visit that we may need additional imaging.  Patient needs to be set up for a PET scan .  Keep follow-up with radiation oncology next week as planned

## 2021-11-03 ENCOUNTER — Encounter (HOSPITAL_COMMUNITY)
Admission: RE | Admit: 2021-11-03 | Discharge: 2021-11-03 | Disposition: A | Discharge status: Home / Self Care | Payer: Medicare HMO | Source: Ambulatory Visit | Attending: Adult Health | Admitting: Adult Health

## 2021-11-03 DIAGNOSIS — I2510 Atherosclerotic heart disease of native coronary artery without angina pectoris: Secondary | ICD-10-CM | POA: Diagnosis not present

## 2021-11-03 DIAGNOSIS — R911 Solitary pulmonary nodule: Secondary | ICD-10-CM | POA: Insufficient documentation

## 2021-11-03 DIAGNOSIS — C3491 Malignant neoplasm of unspecified part of right bronchus or lung: Secondary | ICD-10-CM | POA: Diagnosis not present

## 2021-11-03 DIAGNOSIS — M19011 Primary osteoarthritis, right shoulder: Secondary | ICD-10-CM | POA: Diagnosis not present

## 2021-11-03 DIAGNOSIS — C3490 Malignant neoplasm of unspecified part of unspecified bronchus or lung: Secondary | ICD-10-CM | POA: Diagnosis not present

## 2021-11-03 DIAGNOSIS — J432 Centrilobular emphysema: Secondary | ICD-10-CM | POA: Diagnosis not present

## 2021-11-03 LAB — GLUCOSE, CAPILLARY: Glucose-Capillary: 100 mg/dL — ABNORMAL HIGH (ref 70–99)

## 2021-11-03 MED ORDER — FLUDEOXYGLUCOSE F - 18 (FDG) INJECTION
4.8600 | Freq: Once | INTRAVENOUS | Status: AC | PRN
  Administered 2021-11-03: 4.85 via INTRAVENOUS

## 2021-11-03 NOTE — Progress Notes (Signed)
Thoracic Location of Tumor / Histology: right upper lobe non-small cell carcinoma.   Patient presented {numbers 1-12:19994} months ago with symptoms of: ***  Biopsies revealed:   10/12/2021 FINAL MICROSCOPIC DIAGNOSIS:   A. LUNG, RUL, NEEDLE  BIOPSY:  -Non-small cell carcinoma morphologically consistent with squamous cell  carcinoma.   Tobacco/Marijuana/Snuff/ETOH use: Has smoked 50+ years since the age of 48.   Past/Anticipated interventions by cardiothoracic surgery, if any: {:18581}  Past/Anticipated interventions by medical oncology, if any: {:18581}  Signs/Symptoms Weight changes, if any: {:18581} Respiratory complaints, if any: {:18581} Hemoptysis, if any: {:18581} Pain issues, if any:  {:18581}  SAFETY ISSUES: Prior radiation? {:18581} Pacemaker/ICD? {:18581}  Possible current pregnancy?no Is the patient on methotrexate? {:18581}  Current Complaints / other details:  ***

## 2021-11-03 NOTE — Progress Notes (Signed)
Radiation Oncology         [phone]) [phone] ________________________________  Initial Outpatient Consultation  Name: [name] MRN: [id number]  Date: 11/04/2021  DOB: October 04, 1945  TM:[id number], Christean Grief, MD  Garner Nash, DO   REFERRING PHYSICIAN: Garner Nash, DO  DIAGNOSIS: There were no encounter diagnoses.  Squamous cell carcinoma of the right upper lobe   HISTORY OF PRESENT ILLNESS::[name] is a 76 y.o. female who is accompanied by ***. she is seen as a courtesy of Dr. Valeta Harms for an opinion concerning radiation therapy as part of management for her recently diagnosed right lung cancer.   This past August, the patient presented with symptoms of weight loss and generalized abdominal pain. This prompted a CT of the chest abdomen and pelvis on 09/02/21 which revealed a spiculated 1 cm nodule in the right upper lobe, concerning for malignancy.   Accordingly, the patient was referred to Dr. Valeta Harms on 10/01/21 for further evaluation and management. During this visit, the patient denied any respiratory complaints but reported smoking for 50+ years since she was 76 years old. Moving forward, Dr. Valeta Harms recommended proceeding with bronchoscopy and pre-op imaging for further classification.    Super-D chest CT on 10/13/21 showed no apparent change of the RUL nodule since CT in August. The nodule was seen to abut and distort the minor fissure. CT otherwise showed no evidence of thoracic adenopathy or other findings of metastatic disease in the chest.  Biopsies of the RUL nodule on 10/12/21 under Dr. Valeta Harms revealed non-small cell carcinoma morphologically consistent with squamous cell carcinoma.   Following her bronchoscopy, the patient developed increased cough, congestion, decreased appetite, weakness and shortness of breath. She presented to pulmonology on 10/22/21 for evaluation. CXR taken at the time of the visit showed chronic changes with no acute process noted.  Given her history  of heavy smoking, the patient was treated for acute COPD exacerbation with an albuterol nebulizer treatment in office, as well as a Depo-Medrol 80 mg IM injection. She was also prescribed augmentin, a prednisone taper, and was given stiolto to inhale twice daily.     Of note: patient also has a history significant for bladder cancer and alcohol abuse.   PREVIOUS RADIATION THERAPY: {EXAM; YES/NO:19492::"No"}  PAST MEDICAL HISTORY:  Past Medical History:  Diagnosis Date   Bladder cancer ([place])    Lung nodule    Tobacco use     PAST SURGICAL HISTORY: Past Surgical History:  Procedure Laterality Date   BRONCHIAL BIOPSY  10/12/2021   Procedure: BRONCHIAL BIOPSIES;  Surgeon: Garner Nash, DO;  Location: [facility];  Service: Pulmonary;;   BRONCHIAL NEEDLE ASPIRATION BIOPSY  10/12/2021   Procedure: BRONCHIAL NEEDLE ASPIRATION BIOPSIES;  Surgeon: Garner Nash, DO;  Location: [facility];  Service: Pulmonary;;   CYSTOSCOPY     patient thinks at [facility]  10/12/2021   Procedure: FIDUCIAL MARKER PLACEMENT;  Surgeon: Garner Nash, DO;  Location: [facility];  Service: Pulmonary;;    FAMILY HISTORY:  Family History  Problem Relation Age of Onset   Breast cancer Neg Hx     SOCIAL HISTORY:  Social History   Tobacco Use   Smoking status: Every Day    Types: Cigarettes   Smokeless tobacco: Never   Tobacco comments:    Smoking every now and then.  Not smoking every day since the bronch. She is trying to quit.  10/22/2021 hfb  Vaping Use   Vaping Use:  Never used  Substance Use Topics   Alcohol use: Yes    Alcohol/week: 14.0 standard drinks of alcohol    Types: 14 Cans of beer per week    Comment: per   Drug use: Not Currently    ALLERGIES: No Known Allergies  MEDICATIONS:  Current Outpatient Medications  Medication Sig Dispense Refill   albuterol (VENTOLIN HFA) 108 (90 Base) MCG/ACT inhaler Inhale 1-2 puffs into the lungs every 6 (six) hours as  needed. 8 g 2   amoxicillin-clavulanate (AUGMENTIN) 875-125 MG tablet Take 1 tablet by mouth 2 (two) times daily. 14 tablet 0   ibuprofen (ADVIL) 800 MG tablet Take 800 mg by mouth 2 (two) times daily as needed.     mirtazapine (REMERON) 7.5 MG tablet SMARTSIG:1 Tablet(s) By Mouth Every Evening     Multiple Vitamin (MULTIVITAMIN) tablet Take 1 tablet by mouth daily.     pramipexole (MIRAPEX) 0.25 MG tablet Take 0.25 mg by mouth at bedtime.     predniSONE (DELTASONE) 10 MG tablet 4 tabs for 2 days, then 3 tabs for 2 days, 2 tabs for 2 days, then 1 tab for 2 days, then stop 20 tablet 0   Tiotropium Bromide-Olodaterol (STIOLTO RESPIMAT) 2.5-2.5 MCG/ACT AERS Inhale 2 puffs into the lungs daily. 1 each 5   Tiotropium Bromide-Olodaterol (STIOLTO RESPIMAT) 2.5-2.5 MCG/ACT AERS Inhale 2 puffs into the lungs daily. 4 g 0   tiZANidine (ZANAFLEX) 2 MG tablet Take by mouth.     No current facility-administered medications for this encounter.    REVIEW OF SYSTEMS:  A 10+ POINT REVIEW OF SYSTEMS WAS OBTAINED including neurology, dermatology, psychiatry, cardiac, respiratory, lymph, extremities, GI, GU, musculoskeletal, constitutional, reproductive, HEENT. ***   PHYSICAL EXAM:  vitals were not taken for this visit.   General: Alert and oriented, in no acute distress HEENT: Head is normocephalic. Extraocular movements are intact. Oropharynx is clear. Neck: Neck is supple, no palpable cervical or supraclavicular lymphadenopathy. Heart: Regular in rate and rhythm with no murmurs, rubs, or gallops. Chest: Clear to auscultation bilaterally, with no rhonchi, wheezes, or rales. Abdomen: Soft, nontender, nondistended, with no rigidity or guarding. Extremities: No cyanosis or edema. Lymphatics: see Neck Exam Skin: No concerning lesions. Musculoskeletal: symmetric strength and muscle tone throughout. Neurologic: Cranial nerves II through XII are grossly intact. No obvious focalities. Speech is fluent. Coordination  is intact. Psychiatric: Judgment and insight are intact. Affect is appropriate. ***  ECOG = ***  0 - Asymptomatic (Fully active, able to carry on all predisease activities without restriction)  1 - Symptomatic but completely ambulatory (Restricted in physically strenuous activity but ambulatory and able to carry out work of a light or sedentary nature. For example, light housework, office work)  2 - Symptomatic, <50% in bed during the day (Ambulatory and capable of all self care but unable to carry out any work activities. Up and about more than 50% of waking hours)  3 - Symptomatic, >50% in bed, but not bedbound (Capable of only limited self-care, confined to bed or chair 50% or more of waking hours)  4 - Bedbound (Completely disabled. Cannot carry on any self-care. Totally confined to bed or chair)  5 - Death   Eustace Pen MM, Creech RH, Tormey DC, et al. [phone]). "Toxicity and response criteria of the [facility]". [place] Oncol. 5 (6): 649-55  LABORATORY DATA:  Lab Results  Component Value Date   WBC 3.0 (L) 10/12/2021   HGB 12.8 10/12/2021   HCT  37.8 10/12/2021   MCV 99.7 10/12/2021   PLT 234 10/12/2021   NEUTROABS 0.5 (L) 11/13/2007   Lab Results  Component Value Date   NA 139 09/12/2007   K 3.7 09/12/2007   CL 103 09/12/2007   CO2 22 09/12/2007   GLUCOSE 114 (H) 09/12/2007   BUN 5 (L) 09/12/2007   CREATININE 0.56 09/12/2007   CALCIUM 9.0 09/12/2007      RADIOGRAPHY: DG Chest 2 View  Result Date: 10/22/2021 CLINICAL DATA:  Cough and congestion post bronchoscopy. EXAM: CHEST - 2 VIEW COMPARISON:  Chest x-ray dated 10/12/2021. FINDINGS: Heart size and mediastinal contours are stable. Lungs are hyperexpanded. RIGHT upper lobe nodule is better visualized on the recent chest CT of 10/12/2021. No new lung findings. No pleural effusion or pneumothorax is seen. IMPRESSION: 1. No acute findings. No pleural effusion or pneumothorax is seen status post  bronchoscopy. 2. RIGHT upper lobe spiculated pulmonary nodule is better visualized on recent chest CT of 10/12/2021. 3. COPD. Electronically Signed   By: Franki Cabot M.D.   On: 10/22/2021 15:14   DG Chest Port 1 View  Result Date: 10/12/2021 CLINICAL DATA:  Follow-up bronchoscopy EXAM: PORTABLE CHEST 1 VIEW COMPARISON:  09/13/2007 FINDINGS: Heart size is normal. Chronic tortuous aorta. Clip marker in the right upper to mid lung. Minimal hazy opacity in that region consistent with the presence of a nodule in subsequent biopsy. No advanced finding. No pneumothorax or hemothorax. IMPRESSION: Clip marker in the right mid to upper lung. Mild hazy opacity in that region consistent with recent bronchoscopy and biopsy. No pneumothorax. Electronically Signed   By: Nelson Chimes M.D.   On: 10/12/2021 12:59   DG C-ARM BRONCHOSCOPY  Result Date: 10/12/2021 C-ARM BRONCHOSCOPY: Fluoroscopy was utilized by the requesting physician.  No radiographic interpretation.   CT Super D Chest Wo Contrast  Result Date: 10/12/2021 CLINICAL DATA:  Pulmonary nodule, preoperative. * Tracking Code: BO * EXAM: CT CHEST WITHOUT CONTRAST TECHNIQUE: Multidetector CT imaging of the chest was performed using thin slice collimation for electromagnetic bronchoscopy planning purposes, without intravenous contrast. RADIATION DOSE REDUCTION: This exam was performed according to the departmental dose-optimization program which includes automated exposure control, adjustment of the mA and/or kV according to patient size and/or use of iterative reconstruction technique. COMPARISON:  09/02/2021 CT chest, abdomen and pelvis. FINDINGS: Cardiovascular: Normal heart size. No significant pericardial effusion/thickening. Three-vessel coronary atherosclerosis. Atherosclerotic nonaneurysmal thoracic aorta. Normal caliber pulmonary arteries. Mediastinum/Nodes: No discrete thyroid nodules. Unremarkable esophagus. No pathologically enlarged axillary,  mediastinal or hilar lymph nodes, noting limited sensitivity for the detection of hilar adenopathy on this noncontrast study. Lungs/Pleura: No pneumothorax. No pleural effusion. Mild paraseptal and centrilobular emphysema. Spiculated 1.0 x 0.9 cm basilar right upper lobe solid pulmonary nodule (series 8/image 77), which abuts and slightly distorts the minor fissure, not appreciably changed. No acute consolidative airspace disease or additional significant pulmonary nodules. Upper abdomen: Left colonic diverticulosis. Musculoskeletal: No aggressive appearing focal osseous lesions. Moderate thoracic spondylosis. IMPRESSION: 1. Spiculated 1.0 cm basilar right upper lobe solid pulmonary nodule, not appreciably changed since 09/02/2021 chest CT, suspicious for primary bronchogenic carcinoma. This nodule abuts and slightly distorts the minor fissure. 2. No thoracic adenopathy or other findings of metastatic disease in the chest. 3. Three-vessel coronary atherosclerosis. 4. Left colonic diverticulosis. 5. Aortic Atherosclerosis (ICD10-I70.0) and Emphysema (ICD10-J43.9). Electronically Signed   By: Ilona Sorrel M.D.   On: 10/12/2021 08:42      IMPRESSION: Squamous cell carcinoma of the right upper  lobe   ***  Today, I talked to the patient and family about the findings and work-up thus far.  We discussed the natural history of *** and general treatment, highlighting the role of radiotherapy in the management.  We discussed the available radiation techniques, and focused on the details of logistics and delivery.  We reviewed the anticipated acute and late sequelae associated with radiation in this setting.  The patient was encouraged to ask questions that I answered to the best of my ability. *** A patient consent form was discussed and signed.  We retained a copy for our records.  The patient would like to proceed with radiation and will be scheduled for CT simulation.  PLAN: ***    *** minutes of total time was  spent for this patient encounter, including preparation, face-to-face counseling with the patient and coordination of care, physical exam, and documentation of the encounter.   ------------------------------------------------  Blair Promise, PhD, MD  This document serves as a record of services personally performed by Gery Pray, MD. It was created on his behalf by Roney Mans, a trained medical scribe. The creation of this record is based on the scribe's personal observations and the provider's statements to them. This document has been checked and approved by the attending provider.

## 2021-11-04 ENCOUNTER — Ambulatory Visit
Admission: RE | Admit: 2021-11-04 | Discharge: 2021-11-04 | Disposition: A | Discharge status: Home / Self Care | Payer: Medicare HMO | Source: Ambulatory Visit | Attending: Radiation Oncology | Admitting: Radiation Oncology

## 2021-11-04 ENCOUNTER — Other Ambulatory Visit: Payer: Self-pay

## 2021-11-04 ENCOUNTER — Encounter: Payer: Self-pay | Admitting: Radiation Oncology

## 2021-11-04 VITALS — BP 104/63 | HR 87 | Temp 97.6°F | Resp 20 | Ht 66.0 in | Wt 94.2 lb

## 2021-11-04 DIAGNOSIS — M8580 Other specified disorders of bone density and structure, unspecified site: Secondary | ICD-10-CM | POA: Diagnosis not present

## 2021-11-04 DIAGNOSIS — F17210 Nicotine dependence, cigarettes, uncomplicated: Secondary | ICD-10-CM | POA: Insufficient documentation

## 2021-11-04 DIAGNOSIS — Z79899 Other long term (current) drug therapy: Secondary | ICD-10-CM | POA: Insufficient documentation

## 2021-11-04 DIAGNOSIS — R911 Solitary pulmonary nodule: Secondary | ICD-10-CM | POA: Insufficient documentation

## 2021-11-04 DIAGNOSIS — Z801 Family history of malignant neoplasm of trachea, bronchus and lung: Secondary | ICD-10-CM | POA: Diagnosis not present

## 2021-11-04 DIAGNOSIS — F1010 Alcohol abuse, uncomplicated: Secondary | ICD-10-CM | POA: Diagnosis not present

## 2021-11-04 DIAGNOSIS — Z8551 Personal history of malignant neoplasm of bladder: Secondary | ICD-10-CM | POA: Diagnosis not present

## 2021-11-04 DIAGNOSIS — Z7952 Long term (current) use of systemic steroids: Secondary | ICD-10-CM | POA: Diagnosis not present

## 2021-11-04 DIAGNOSIS — I700 Atherosclerosis of aorta: Secondary | ICD-10-CM | POA: Diagnosis not present

## 2021-11-04 DIAGNOSIS — K5730 Diverticulosis of large intestine without perforation or abscess without bleeding: Secondary | ICD-10-CM | POA: Insufficient documentation

## 2021-11-04 DIAGNOSIS — C3411 Malignant neoplasm of upper lobe, right bronchus or lung: Secondary | ICD-10-CM

## 2021-11-04 DIAGNOSIS — J432 Centrilobular emphysema: Secondary | ICD-10-CM | POA: Insufficient documentation

## 2021-11-04 DIAGNOSIS — Z803 Family history of malignant neoplasm of breast: Secondary | ICD-10-CM | POA: Diagnosis not present

## 2021-11-04 DIAGNOSIS — R634 Abnormal weight loss: Secondary | ICD-10-CM | POA: Insufficient documentation

## 2021-11-05 ENCOUNTER — Ambulatory Visit
Admission: RE | Admit: 2021-11-05 | Discharge: 2021-11-05 | Disposition: A | Discharge status: Home / Self Care | Payer: Medicare HMO | Source: Ambulatory Visit | Attending: Radiation Oncology | Admitting: Radiation Oncology

## 2021-11-05 DIAGNOSIS — F17210 Nicotine dependence, cigarettes, uncomplicated: Secondary | ICD-10-CM | POA: Diagnosis not present

## 2021-11-05 DIAGNOSIS — C3411 Malignant neoplasm of upper lobe, right bronchus or lung: Secondary | ICD-10-CM | POA: Diagnosis not present

## 2021-11-05 DIAGNOSIS — Z510 Encounter for antineoplastic radiation therapy: Secondary | ICD-10-CM | POA: Diagnosis not present

## 2021-11-15 DIAGNOSIS — C3411 Malignant neoplasm of upper lobe, right bronchus or lung: Secondary | ICD-10-CM | POA: Diagnosis not present

## 2021-11-15 DIAGNOSIS — F17210 Nicotine dependence, cigarettes, uncomplicated: Secondary | ICD-10-CM | POA: Diagnosis not present

## 2021-11-16 ENCOUNTER — Telehealth: Payer: Self-pay | Admitting: *Deleted

## 2021-11-16 DIAGNOSIS — Z510 Encounter for antineoplastic radiation therapy: Secondary | ICD-10-CM | POA: Diagnosis not present

## 2021-11-16 DIAGNOSIS — F17210 Nicotine dependence, cigarettes, uncomplicated: Secondary | ICD-10-CM | POA: Diagnosis not present

## 2021-11-16 DIAGNOSIS — C3411 Malignant neoplasm of upper lobe, right bronchus or lung: Secondary | ICD-10-CM | POA: Diagnosis not present

## 2021-11-16 NOTE — Telephone Encounter (Signed)
"  [name] daughter [name] [phone]) calling to check status of intermittent FMLA form.  She is to begin radiation and the form is due to be returned soon." Advised her form is next in queue for this nurse to begin today.  Discussed pick-up process.  Would like a call when ready.  At this time this nurse completed paperwork.  Folder placed in provider in mail bin on office door for review, signature and return.

## 2021-11-18 NOTE — Telephone Encounter (Signed)
Today this nurse received signed FMLA form.  Successfully faxed to [place] [phone]).  Original copy to alphabetical file folder for patient pickup upon next scheduled appointment near registrar number one of [facility] appointment registration work area.  Copy to radiation bin designated for items to be scanned completes process.  No further instructions received, actions required or performed by this nurse.

## 2021-11-19 ENCOUNTER — Ambulatory Visit (INDEPENDENT_AMBULATORY_CARE_PROVIDER_SITE_OTHER): Payer: Medicare HMO | Admitting: Adult Health

## 2021-11-19 ENCOUNTER — Encounter: Payer: Self-pay | Admitting: Adult Health

## 2021-11-19 VITALS — BP 100/40 | HR 82 | Temp 98.3°F | Ht 66.0 in | Wt 92.2 lb

## 2021-11-19 DIAGNOSIS — J441 Chronic obstructive pulmonary disease with (acute) exacerbation: Secondary | ICD-10-CM | POA: Diagnosis not present

## 2021-11-19 DIAGNOSIS — C3411 Malignant neoplasm of upper lobe, right bronchus or lung: Secondary | ICD-10-CM

## 2021-11-19 DIAGNOSIS — Z23 Encounter for immunization: Secondary | ICD-10-CM

## 2021-11-19 DIAGNOSIS — E43 Unspecified severe protein-calorie malnutrition: Secondary | ICD-10-CM | POA: Diagnosis not present

## 2021-11-19 DIAGNOSIS — E46 Unspecified protein-calorie malnutrition: Secondary | ICD-10-CM | POA: Insufficient documentation

## 2021-11-19 DIAGNOSIS — Z0000 Encounter for general adult medical examination without abnormal findings: Secondary | ICD-10-CM

## 2021-11-19 NOTE — Assessment & Plan Note (Signed)
High-protein diet 

## 2021-11-19 NOTE — Progress Notes (Signed)
@Patient  ID: [name], female    DOB: 1945/06/28, 76 y.o.   MRN: [id number]  Chief Complaint  Patient presents with   Follow-up    Referring provider: Nolene Ebbs, MD  HPI: 76 year old female seen for pulmonary consult October 01, 2021 for suspicious lung nodule in the right upper lobe found to have a biopsy-proven squamous cell carcinoma (navigational bronc October 12, 2021)  TEST/EVENTS :   11/19/2021 Follow up : COPD  Patient presents for 1 month follow-up.  Patient is being followed for lung nodule and underwent navigational bronchoscopy on October 12, 2021 that was positive for squamous cell carcinoma.  PET scan on November 03, 2021 showed spiculated right upper lobe nodule that was hypermetabolic with SUV max at 2.0.  Mildly hypermetabolic small subcarinal, left peritracheal and left periesophageal lymph nodes and right hilum and left hilum favored to be reactive rather than metastatic.  Patient was referred to radiation oncology she was seen by Dr. Sondra Come on November 04, 2021.  Targeted SBRT x3 sessions to the right upper lobe.  She will begin Next week.    Last visit patient was having COPD flare with increased cough congestion and shortness of breath.  COVID-19 and influenza testing were negative.  She was treated with Augmentin x1 week and prednisone.  She was started on Stiolto. Did not feel she needed it so she finished the sample and stopped. Rare use of albuterol.  She says she is feeling much better.  Cough and congestion have improved.     No Known Allergies  Immunization History  Administered Date(s) Administered   Influenza Whole 11/27/2006   Pneumococcal Polysaccharide-23 03/31/2005   Td 03/31/2005    Past Medical History:  Diagnosis Date   Bladder cancer ([place])    Lung nodule    Tobacco use     Tobacco History: Social History   Tobacco Use  Smoking Status Every Day   Packs/day: 1.00   Types: Cigarettes  Smokeless Tobacco Never  Tobacco  Comments   No cigarettes in a week and a half.  11/19/21 hfb   Ready to quit: Not Answered Counseling given: Not Answered Tobacco comments: No cigarettes in a week and a half.  11/19/21 hfb   Outpatient Medications Prior to Visit  Medication Sig Dispense Refill   albuterol (VENTOLIN HFA) 108 (90 Base) MCG/ACT inhaler Inhale 1-2 puffs into the lungs every 6 (six) hours as needed. 8 g 2   ibuprofen (ADVIL) 800 MG tablet Take 800 mg by mouth 2 (two) times daily as needed.     mirtazapine (REMERON) 7.5 MG tablet SMARTSIG:1 Tablet(s) By Mouth Every Evening     Multiple Vitamin (MULTIVITAMIN) tablet Take 1 tablet by mouth daily.     pramipexole (MIRAPEX) 0.25 MG tablet Take 0.25 mg by mouth at bedtime.     Tiotropium Bromide-Olodaterol (STIOLTO RESPIMAT) 2.5-2.5 MCG/ACT AERS Inhale 2 puffs into the lungs daily. 1 each 5   tiZANidine (ZANAFLEX) 2 MG tablet Take by mouth.     amoxicillin-clavulanate (AUGMENTIN) 875-125 MG tablet Take 1 tablet by mouth 2 (two) times daily. (Patient not taking: Reported on 11/04/2021) 14 tablet 0   predniSONE (DELTASONE) 10 MG tablet 4 tabs for 2 days, then 3 tabs for 2 days, 2 tabs for 2 days, then 1 tab for 2 days, then stop (Patient not taking: Reported on 11/04/2021) 20 tablet 0   Tiotropium Bromide-Olodaterol (STIOLTO RESPIMAT) 2.5-2.5 MCG/ACT AERS Inhale 2 puffs into the lungs daily. (Patient not taking: Reported  on 11/19/2021) 4 g 0   No facility-administered medications prior to visit.     Review of Systems:   Constitutional:   No  weight loss, night sweats,  Fevers, chills,  +fatigue, or  lassitude.  HEENT:   No headaches,  Difficulty swallowing,  Tooth/dental problems, or  Sore throat,                No sneezing, itching, ear ache, nasal congestion, post nasal drip,   CV:  No chest pain,  Orthopnea, PND, swelling in lower extremities, anasarca, dizziness, palpitations, syncope.   GI  No heartburn, indigestion, abdominal pain, nausea, vomiting,  diarrhea, change in bowel habits, loss of appetite, bloody stools.   Resp: .  No chest wall deformity  Skin: no rash or lesions.  GU: no dysuria, change in color of urine, no urgency or frequency.  No flank pain, no hematuria   MS:  No joint pain or swelling.  No decreased range of motion.  No back pain.    Physical Exam  BP (!) 100/40 (BP Location: Left Arm, Patient Position: Sitting, Cuff Size: Normal)   Pulse 82   Temp 98.3 F (36.8 C) (Oral)   Ht 5\' 6"  (1.676 m)   Wt 92 lb 3.2 oz (41.8 kg)   SpO2 99%   BMI 14.88 kg/m   GEN: A/Ox3; pleasant , NAD, thin, BMI 14   HEENT:  [place]/AT,  EACs-clear, TMs-wnl, NOSE-clear, THROAT-clear, no lesions, no postnasal drip or exudate noted.   NECK:  Supple w/ fair ROM; no JVD; normal carotid impulses w/o bruits; no thyromegaly or nodules palpated; no lymphadenopathy.    RESP  Clear  P & A; w/o, wheezes/ rales/ or rhonchi. no accessory muscle use, no dullness to percussion  CARD:  RRR, no m/r/g, no peripheral edema, pulses intact, no cyanosis or clubbing.  GI:   Soft & nt; nml bowel sounds; no organomegaly or masses detected.   Musco: Warm bil, no deformities or joint swelling noted.   Neuro: alert, no focal deficits noted.    Skin: Warm, no lesions or rashes    Lab Results:    BMET   BNP No results found for: "BNP"  ProBNP No results found for: "PROBNP"  Imaging: NM PET Image Initial (PI) Skull Base To Thigh  Result Date: 11/04/2021 CLINICAL DATA:  Initial treatment strategy for lung cancer. EXAM: NUCLEAR MEDICINE PET SKULL BASE TO THIGH TECHNIQUE: 4.9 mCi F-18 FDG was injected intravenously. Full-ring PET imaging was performed from the skull base to thigh after the radiotracer. CT data was obtained and used for attenuation correction and anatomic localization. Fasting blood glucose: 100 mg/dl COMPARISON:  CT chest 10/12/2021, 09/02/2021. FINDINGS: Mediastinal blood pool activity: SUV max 2.1 Liver activity: SUV max NA  NECK: No abnormal hypermetabolism. Incidental CT findings: None. CHEST: Hilar activity measures up to SUV max 2.8 on the right. Correlation with CT is challenging without IV contrast. Low left paratracheal lymph node measures 10 mm (4/62), SUV max 2.8. Left periesophageal lymph node measures approximately 5 mm (4/67), SUV max 2.5. Spiculated nodule in the anterior segment right upper lobe is seen with an adjacent fiducial marker, measuring 8 x 8 mm (7/37), SUV max 2.0. Extensive mild hypermetabolism associated with new peribronchovascular nodularity throughout the right middle lobe, lingula and both lower lobes. Incidental CT findings: Atherosclerotic calcification of the aorta and coronary arteries. Enlarged pulmonic trunk. Heart size normal. No pericardial effusion. Centrilobular and paraseptal emphysema. No pleural fluid. ABDOMEN/PELVIS: No abnormal  hypermetabolism in the liver, adrenal glands, spleen or pancreas. No hypermetabolic lymph nodes. Incidental CT findings: Liver, gallbladder, adrenal glands, kidneys, spleen, pancreas, stomach and bowel are grossly unremarkable. SKELETON: Degenerative uptake in the right sternoclavicular joint. No abnormal hypermetabolism. Incidental CT findings: Osteopenia.  Degenerative changes in the spine. IMPRESSION: 1. Spiculated right upper lobe nodule shows hypermetabolism at or just below mediastinal blood pool, compatible with biopsy-proven squamous cell carcinoma. 2. Mildly hypermetabolic small subcarinal, left paratracheal and left periesophageal lymph nodes, with additional hypermetabolism in the right hilum and possibly left hilum. Findings are favored to be reactive (rather than metastatic) given new evidence of bibasilar respiratory bronchiolitis or aspiration. 3. Aortic atherosclerosis (ICD10-I70.0). Coronary artery calcification. 4. Enlarged pulmonic trunk, indicative of pulmonary arterial hypertension. 5.  Emphysema (ICD10-J43.9). Electronically Signed   By: Lorin Picket M.D.   On: 11/04/2021 13:55   DG Chest 2 View  Result Date: 10/22/2021 CLINICAL DATA:  Cough and congestion post bronchoscopy. EXAM: CHEST - 2 VIEW COMPARISON:  Chest x-ray dated 10/12/2021. FINDINGS: Heart size and mediastinal contours are stable. Lungs are hyperexpanded. RIGHT upper lobe nodule is better visualized on the recent chest CT of 10/12/2021. No new lung findings. No pleural effusion or pneumothorax is seen. IMPRESSION: 1. No acute findings. No pleural effusion or pneumothorax is seen status post bronchoscopy. 2. RIGHT upper lobe spiculated pulmonary nodule is better visualized on recent chest CT of 10/12/2021. 3. COPD. Electronically Signed   By: Franki Cabot M.D.   On: 10/22/2021 15:14    albuterol (PROVENTIL) (2.5 MG/3ML) 0.083% nebulizer solution 2.5 mg     Date Action Dose Route User   10/22/2021 1530 Given 2.5 mg Nebulization Nolon Stalls, Kimber Relic, RN      methylPREDNISolone acetate (DEPO-MEDROL) injection 80 mg     Date Action Dose Route User   10/22/2021 1625 Given 80 mg Intramuscular (Right Ventrogluteal) Nolon Stalls, Kimber Relic, RN           No data to display          No results found for: "NITRICOXIDE"      Assessment & Plan:   COPD with acute exacerbation ([place]) Recent exacerbation now resolved.  Patient is to continue on current regimen.  Albuterol as needed.  If continues to have recurrent flare.  We tried to readd maintenance inhaler. Flu shot.   Plan  Patient Instructions  Mucinex DM twice daily as needed for cough and congestion Albuterol inhaler as needed Complete radiation as planned.  Flu shot today.  Refer to PCP-[place].  Follow up with Dr. Valeta Harms in 3 months and As needed   Please contact office for sooner follow up if symptoms do not improve or worsen or seek emergency care     Lung cancer [facility]) Squamous cell lung cancer.  Case discussed at tumor board.  PET scan with hypermetabolic right upper lobe nodule with no  evidence of distant disease (mildly hypermetabolic lymph nodes felt to be reactive versus metastatic) patient has been seen by radiation oncology and will undergo SBRT next week.  Plan  Patient Instructions  Mucinex DM twice daily as needed for cough and congestion Albuterol inhaler as needed Complete radiation as planned.  Flu shot today.  Refer to PCP-[place].  Follow up with Dr. Valeta Harms in 3 months and As needed   Please contact office for sooner follow up if symptoms do not improve or worsen or seek emergency care     Protein calorie malnutrition ([place]) High-protein diet.  Rexene Edison, NP 11/19/2021

## 2021-11-19 NOTE — Assessment & Plan Note (Signed)
Squamous cell lung cancer.  Case discussed at tumor board.  PET scan with hypermetabolic right upper lobe nodule with no evidence of distant disease (mildly hypermetabolic lymph nodes felt to be reactive versus metastatic) patient has been seen by radiation oncology and will undergo SBRT next week.  Plan  Patient Instructions  Mucinex DM twice daily as needed for cough and congestion Albuterol inhaler as needed Complete radiation as planned.  Flu shot today.  Refer to PCP-[place].  Follow up with Dr. Valeta Harms in 3 months and As needed   Please contact office for sooner follow up if symptoms do not improve or worsen or seek emergency care

## 2021-11-19 NOTE — Addendum Note (Signed)
Addended by: Vanessa Barbara on: 11/19/2021 04:01 PM   Modules accepted: Orders

## 2021-11-19 NOTE — Patient Instructions (Addendum)
Mucinex DM twice daily as needed for cough and congestion Albuterol inhaler as needed Complete radiation as planned.  Flu shot today.  Refer to PCP-[place].  Follow up with Dr. Valeta Harms in 3 months and As needed   Please contact office for sooner follow up if symptoms do not improve or worsen or seek emergency care

## 2021-11-19 NOTE — Assessment & Plan Note (Signed)
Recent exacerbation now resolved.  Patient is to continue on current regimen.  Albuterol as needed.  If continues to have recurrent flare.  We tried to readd maintenance inhaler. Flu shot.   Plan  Patient Instructions  Mucinex DM twice daily as needed for cough and congestion Albuterol inhaler as needed Complete radiation as planned.  Flu shot today.  Refer to PCP-[place].  Follow up with Dr. Valeta Harms in 3 months and As needed   Please contact office for sooner follow up if symptoms do not improve or worsen or seek emergency care

## 2021-11-23 ENCOUNTER — Ambulatory Visit
Admission: RE | Admit: 2021-11-23 | Discharge: 2021-11-23 | Disposition: A | Discharge status: Home / Self Care | Payer: Medicare HMO | Source: Ambulatory Visit | Attending: Radiation Oncology | Admitting: Radiation Oncology

## 2021-11-23 ENCOUNTER — Other Ambulatory Visit: Payer: Self-pay

## 2021-11-23 DIAGNOSIS — Z510 Encounter for antineoplastic radiation therapy: Secondary | ICD-10-CM | POA: Diagnosis not present

## 2021-11-23 DIAGNOSIS — C3411 Malignant neoplasm of upper lobe, right bronchus or lung: Secondary | ICD-10-CM

## 2021-11-23 LAB — RAD ONC ARIA SESSION SUMMARY
Course Elapsed Days: 0
Plan Fractions Treated to Date: 1
Plan Prescribed Dose Per Fraction: 18 Gy
Plan Total Fractions Prescribed: 3
Plan Total Prescribed Dose: 54 Gy
Reference Point Dosage Given to Date: 18 Gy
Reference Point Session Dosage Given: 18 Gy
Session Number: 1

## 2021-11-25 ENCOUNTER — Ambulatory Visit
Admission: RE | Admit: 2021-11-25 | Discharge: 2021-11-25 | Disposition: A | Discharge status: Home / Self Care | Payer: Medicare HMO | Source: Ambulatory Visit | Attending: Radiation Oncology | Admitting: Radiation Oncology

## 2021-11-25 ENCOUNTER — Other Ambulatory Visit: Payer: Self-pay

## 2021-11-25 DIAGNOSIS — C3411 Malignant neoplasm of upper lobe, right bronchus or lung: Secondary | ICD-10-CM

## 2021-11-25 DIAGNOSIS — Z510 Encounter for antineoplastic radiation therapy: Secondary | ICD-10-CM | POA: Diagnosis not present

## 2021-11-25 LAB — RAD ONC ARIA SESSION SUMMARY
Course Elapsed Days: 2
Plan Fractions Treated to Date: 2
Plan Prescribed Dose Per Fraction: 18 Gy
Plan Total Fractions Prescribed: 3
Plan Total Prescribed Dose: 54 Gy
Reference Point Dosage Given to Date: 36 Gy
Reference Point Session Dosage Given: 18 Gy
Session Number: 2

## 2021-11-30 ENCOUNTER — Ambulatory Visit
Admission: RE | Admit: 2021-11-30 | Discharge: 2021-11-30 | Disposition: A | Discharge status: Home / Self Care | Payer: Medicare HMO | Source: Ambulatory Visit | Attending: Radiation Oncology | Admitting: Radiation Oncology

## 2021-11-30 ENCOUNTER — Other Ambulatory Visit: Payer: Self-pay

## 2021-11-30 ENCOUNTER — Encounter: Payer: Self-pay | Admitting: Radiation Oncology

## 2021-11-30 DIAGNOSIS — C3411 Malignant neoplasm of upper lobe, right bronchus or lung: Secondary | ICD-10-CM | POA: Diagnosis not present

## 2021-11-30 DIAGNOSIS — Z510 Encounter for antineoplastic radiation therapy: Secondary | ICD-10-CM | POA: Diagnosis not present

## 2021-11-30 DIAGNOSIS — F17210 Nicotine dependence, cigarettes, uncomplicated: Secondary | ICD-10-CM | POA: Diagnosis not present

## 2021-11-30 LAB — RAD ONC ARIA SESSION SUMMARY
Course Elapsed Days: 7
Plan Fractions Treated to Date: 3
Plan Prescribed Dose Per Fraction: 18 Gy
Plan Total Fractions Prescribed: 3
Plan Total Prescribed Dose: 54 Gy
Reference Point Dosage Given to Date: 54 Gy
Reference Point Session Dosage Given: 18 Gy
Session Number: 3

## 2021-12-03 ENCOUNTER — Ambulatory Visit (HOSPITAL_COMMUNITY): Payer: Medicare HMO

## 2021-12-16 DIAGNOSIS — M1990 Unspecified osteoarthritis, unspecified site: Secondary | ICD-10-CM | POA: Diagnosis not present

## 2021-12-16 DIAGNOSIS — G4700 Insomnia, unspecified: Secondary | ICD-10-CM | POA: Diagnosis not present

## 2021-12-16 DIAGNOSIS — Z801 Family history of malignant neoplasm of trachea, bronchus and lung: Secondary | ICD-10-CM | POA: Diagnosis not present

## 2021-12-16 DIAGNOSIS — J439 Emphysema, unspecified: Secondary | ICD-10-CM | POA: Diagnosis not present

## 2021-12-16 DIAGNOSIS — Z681 Body mass index (BMI) 19 or less, adult: Secondary | ICD-10-CM | POA: Diagnosis not present

## 2021-12-16 DIAGNOSIS — I700 Atherosclerosis of aorta: Secondary | ICD-10-CM | POA: Diagnosis not present

## 2021-12-16 DIAGNOSIS — K08109 Complete loss of teeth, unspecified cause, unspecified class: Secondary | ICD-10-CM | POA: Diagnosis not present

## 2021-12-16 DIAGNOSIS — G2581 Restless legs syndrome: Secondary | ICD-10-CM | POA: Diagnosis not present

## 2021-12-16 DIAGNOSIS — R32 Unspecified urinary incontinence: Secondary | ICD-10-CM | POA: Diagnosis not present

## 2021-12-16 DIAGNOSIS — F419 Anxiety disorder, unspecified: Secondary | ICD-10-CM | POA: Diagnosis not present

## 2021-12-16 DIAGNOSIS — R636 Underweight: Secondary | ICD-10-CM | POA: Diagnosis not present

## 2021-12-16 DIAGNOSIS — F17200 Nicotine dependence, unspecified, uncomplicated: Secondary | ICD-10-CM | POA: Diagnosis not present

## 2021-12-30 ENCOUNTER — Ambulatory Visit: Payer: Self-pay | Admitting: Radiation Oncology

## 2021-12-30 ENCOUNTER — Encounter: Payer: Self-pay | Admitting: Radiation Oncology

## 2022-01-01 NOTE — Progress Notes (Incomplete)
  Radiation Oncology         [phone] ________________________________  Patient Name: [name] MRN: [id number] DOB: 10/02/1945 Referring Physician: June Leap Date of Service: 11/30/2021 [facility] [place]                                                        End Of Treatment Note  Diagnoses: C34.11-Malignant neoplasm of upper lobe, right bronchus or lung  Cancer Staging: The encounter diagnosis was Malignant neoplasm of upper lobe of right lung ([place]).   Squamous cell carcinoma of the right upper lobe , Stage IA1 (cT1a, cN0, cM0)   Cancer Staging  Lung cancer [facility]) Staging form: Lung, AJCC 8th Edition - Clinical stage from 11/04/2021: Stage IA1 (cT1a, cN0, cM0) - Signed by Gery Pray, MD on 11/04/2021  Intent: Curative  Radiation Treatment Dates: 11/23/2021 through 11/30/2021 Site Technique Total Dose (Gy) Dose per Fx (Gy) Completed Fx Beam Energies  Lung, Right: Lung_R IMRT 54/54 18 3/3 6XFFF   Narrative: The patient tolerated radiation therapy relatively well. On the date of her final treatment, the patient reported fatigue, and a small black colored area on her mid back. Physical exam performed on the date of her final treatment showed a darkened area on the upper-mid back area without signs of infection.  I asked the patient's family member to continue to observe this area and to let us or her PCP know if any changes occur.   Plan: The patient will follow-up with radiation oncology in one month .  ________________________________________________ -----------------------------------  Blair Promise, PhD, MD  This document serves as a record of services personally performed by Gery Pray, MD. It was created on his behalf by Roney Mans, a trained medical scribe. The creation of this record is based on the scribe's personal observations and the provider's statements to them. This document has been checked and approved by the attending  provider.

## 2022-01-02 NOTE — Progress Notes (Signed)
Radiation Oncology         [phone] ________________________________  Name: [name] MRN: [id number]  Date: 01/03/2022  DOB: April 10, 1945  Follow-Up Visit Note  CC: Nolene Ebbs, MD  Garner Nash, DO  No diagnosis found.  Diagnosis: The encounter diagnosis was Malignant neoplasm of upper lobe of right lung ([place]).   Squamous cell carcinoma of the right upper lobe , Stage IA1 (cT1a, cN0, cM0)    Cancer Staging  Lung cancer [facility]) Staging form: Lung, AJCC 8th Edition - Clinical stage from 11/04/2021: Stage IA1 (cT1a, cN0, cM0) - Signed by Gery Pray, MD on 11/04/2021  Interval Since Last Radiation: 1 month and 3 days   Intent: Curative  Radiation Treatment Dates: 11/23/2021 through 11/30/2021 Site Technique Total Dose (Gy) Dose per Fx (Gy) Completed Fx Beam Energies  Lung, Right: Lung_R IMRT 54/54 18 3/3 6XFFF   Narrative:  The patient returns today for routine follow-up.  The patient tolerated radiation therapy relatively well. On the date of her final treatment, the patient reported fatigue, and a small black colored area on her mid back. Physical exam performed on the date of her final treatment showed a darkened area on the upper-mid back area without signs of infection.  I asked the patient's family member to continue to observe this area, and to let us or her PCP know if any changes occur.  Prior to beginning SBRT, the patient followed up with pulmonology on 11/19/21. During which time, the patient reported feeling better since her COPD flare up which occurred this past September (treated with Augmentin x1 week and prednisone).         Otherwise, no significant interval history since the patient completed radiation therapy.     ***                         Allergies:  has No Known Allergies.  Meds: Current Outpatient Medications  Medication Sig Dispense Refill   albuterol (VENTOLIN HFA) 108 (90 Base) MCG/ACT inhaler Inhale 1-2 puffs into the lungs every 6  (six) hours as needed. 8 g 2   ibuprofen (ADVIL) 800 MG tablet Take 800 mg by mouth 2 (two) times daily as needed.     mirtazapine (REMERON) 7.5 MG tablet SMARTSIG:1 Tablet(s) By Mouth Every Evening     Multiple Vitamin (MULTIVITAMIN) tablet Take 1 tablet by mouth daily.     pramipexole (MIRAPEX) 0.25 MG tablet Take 0.25 mg by mouth at bedtime.     Tiotropium Bromide-Olodaterol (STIOLTO RESPIMAT) 2.5-2.5 MCG/ACT AERS Inhale 2 puffs into the lungs daily. 1 each 5   tiZANidine (ZANAFLEX) 2 MG tablet Take by mouth.     No current facility-administered medications for this encounter.    Physical Findings: The patient is in no acute distress. Patient is alert and oriented.  vitals were not taken for this visit. .  No significant changes. Lungs are clear to auscultation bilaterally. Heart has regular rate and rhythm. No palpable cervical, supraclavicular, or axillary adenopathy. Abdomen soft, non-tender, normal bowel sounds.   Lab Findings: Lab Results  Component Value Date   WBC 3.0 (L) 10/12/2021   HGB 12.8 10/12/2021   HCT 37.8 10/12/2021   MCV 99.7 10/12/2021   PLT 234 10/12/2021    Radiographic Findings: No results found.  Impression: The encounter diagnosis was Malignant neoplasm of upper lobe of right lung ([place]).   Squamous cell carcinoma of the right upper lobe , Stage IA1 (cT1a,  cN0, cM0)    The patient is recovering from the effects of radiation.  ***  Plan:  ***   *** minutes of total time was spent for this patient encounter, including preparation, face-to-face counseling with the patient and coordination of care, physical exam, and documentation of the encounter. ____________________________________  Blair Promise, PhD, MD  This document serves as a record of services personally performed by Gery Pray, MD. It was created on his behalf by Roney Mans, a trained medical scribe. The creation of this record is based on the scribe's personal observations and the  provider's statements to them. This document has been checked and approved by the attending provider.

## 2022-01-03 ENCOUNTER — Other Ambulatory Visit: Payer: Self-pay

## 2022-01-03 ENCOUNTER — Encounter: Payer: Self-pay | Admitting: Radiation Oncology

## 2022-01-03 ENCOUNTER — Ambulatory Visit
Admission: RE | Admit: 2022-01-03 | Discharge: 2022-01-03 | Disposition: A | Discharge status: Home / Self Care | Payer: Medicare HMO | Source: Ambulatory Visit | Attending: Radiation Oncology | Admitting: Radiation Oncology

## 2022-01-03 VITALS — BP 99/58 | HR 73 | Temp 96.7°F | Resp 18 | Ht 66.0 in | Wt 93.0 lb

## 2022-01-03 DIAGNOSIS — C3411 Malignant neoplasm of upper lobe, right bronchus or lung: Secondary | ICD-10-CM

## 2022-01-03 HISTORY — DX: Personal history of irradiation: Z92.3

## 2022-01-03 NOTE — Progress Notes (Signed)
[name] is here today for follow up post radiation to the lung.  Lung Side: Right,patient completed treatment on 11/30/21.  Does the patient complain of any of the following: Pain:No Shortness of breath w/wo exertion: No Cough:  Yes, dry cough Hemoptysis: No Pain with swallowing: Yes Swallowing/choking concerns: No Appetite: Fair Weight:  Wt Readings from Last 3 Encounters:  01/03/22 93 lb (42.2 kg)  11/19/21 92 lb 3.2 oz (41.8 kg)  11/04/21 94 lb 3.2 oz (42.7 kg)   Energy Level: Good Post radiation skin Changes: No    Additional comments if applicable:   BP (!) 07/35 (BP Location: Left Arm, Patient Position: Sitting)   Pulse 73   Temp (!) 96.7 F (35.9 C) (Temporal)   Resp 18   Ht 5\' 6"  (1.676 m)   Wt 93 lb (42.2 kg)   SpO2 100%   BMI 15.01 kg/m

## 2022-02-15 DIAGNOSIS — Z0000 Encounter for general adult medical examination without abnormal findings: Secondary | ICD-10-CM | POA: Diagnosis not present

## 2022-02-18 ENCOUNTER — Ambulatory Visit: Payer: Medicare HMO | Admitting: Adult Health

## 2022-03-04 ENCOUNTER — Ambulatory Visit: Payer: Medicare HMO | Admitting: Adult Health

## 2022-03-11 ENCOUNTER — Ambulatory Visit: Payer: Medicare HMO | Admitting: Adult Health

## 2022-03-11 ENCOUNTER — Encounter: Payer: Self-pay | Admitting: Adult Health

## 2022-03-11 VITALS — BP 102/62 | HR 56 | Temp 97.6°F | Ht 66.0 in | Wt 98.0 lb

## 2022-03-11 DIAGNOSIS — E43 Unspecified severe protein-calorie malnutrition: Secondary | ICD-10-CM

## 2022-03-11 DIAGNOSIS — J449 Chronic obstructive pulmonary disease, unspecified: Secondary | ICD-10-CM | POA: Diagnosis not present

## 2022-03-11 DIAGNOSIS — Z23 Encounter for immunization: Secondary | ICD-10-CM

## 2022-03-11 DIAGNOSIS — C3411 Malignant neoplasm of upper lobe, right bronchus or lung: Secondary | ICD-10-CM

## 2022-03-11 NOTE — Patient Instructions (Addendum)
Mucinex DM twice daily as needed for cough and congestion Albuterol inhaler as needed Work on not smoking .  Prevnar 20 vaccine today .  Follow up with Dr. Valeta Harms in 4-6 months with PFT and As needed   Please contact office for sooner follow up if symptoms do not improve or worsen or seek emergency care

## 2022-03-11 NOTE — Assessment & Plan Note (Signed)
Squamous cell lung cancer-right upper lobe nodule status post SBRT  Continue follow-up with radiation oncology.  CT chest next month as planned

## 2022-03-11 NOTE — Progress Notes (Signed)
@Patient  ID: [name], female    DOB: [birth year], 77 y.o.   MRN: [id number]  Chief Complaint  Patient presents with   Follow-up    Referring provider: Nolene Ebbs, MD  HPI: 77 year old female active smoker seen for pulmonary consult October 01, 2021 for suspicious lung nodule found to have biopsy-proven squamous cell carcinoma.  Patient has COPD  TEST/EVENTS :   03/11/2022 Follow up ; COPD , Lung cancer  Patient presents for a 39-month follow-up.  Patient was diagnosed recently with squamous cell lung cancer.  She underwent navigational bronchoscopy October 12, 2021 with cytology positive for squamous cell carcinoma.  PET scan on November 03, 2021 showed a spiculated right upper lobe nodule that was hypermetabolic with SUV max at 2.0.  And mildly hypermetabolic subcarinal, paratracheal, right hilum and left hilum possibly reactive versus metastatic.  Patient was referred to radiation oncology and underwent SBRT x 3 sessions to the right upper lobe nodule.  She has an upcoming CT chest on April 01, 2022.  She says she told radiation treatments without any no difficulty.   Patient is an active smoker.  Has underlying COPD.  Since last visit patient is feeling better with no significant increased coughing.  She denies any fever or chest pain.  Patient says she is trying to eat more.  Weight is up a few pounds.  Currently at 98 pounds.  She denies any hemoptysis, chest pain, orthopnea.  No Known Allergies  Immunization History  Administered Date(s) Administered   Fluad Quad(high Dose 65+) 11/19/2021   Influenza Whole 11/27/2006   Pneumococcal Polysaccharide-23 03/31/2005   Td 03/31/2005    Past Medical History:  Diagnosis Date   Bladder cancer [facility])    History of radiation therapy    Right Lung- 11/23/21-11/30/21- Dr. Gery Pray   Lung nodule    Tobacco use     Tobacco History: Social History   Tobacco Use  Smoking Status Every Day   Packs/day: 1.00   Types:  Cigarettes  Smokeless Tobacco Never  Tobacco Comments   No cigarettes in a week and a half.  11/19/21 hfb   Ready to quit: No Counseling given: Yes Tobacco comments: No cigarettes in a week and a half.  11/19/21 hfb   Outpatient Medications Prior to Visit  Medication Sig Dispense Refill   ibuprofen (ADVIL) 800 MG tablet Take 800 mg by mouth 2 (two) times daily as needed.     albuterol (VENTOLIN HFA) 108 (90 Base) MCG/ACT inhaler Inhale 1-2 puffs into the lungs every 6 (six) hours as needed. (Patient not taking: Reported on 01/03/2022) 8 g 2   mirtazapine (REMERON) 7.5 MG tablet SMARTSIG:1 Tablet(s) By Mouth Every Evening (Patient not taking: Reported on 01/03/2022)     Multiple Vitamin (MULTIVITAMIN) tablet Take 1 tablet by mouth daily. (Patient not taking: Reported on 01/03/2022)     pramipexole (MIRAPEX) 0.25 MG tablet Take 0.25 mg by mouth at bedtime. (Patient not taking: Reported on 01/03/2022)     Tiotropium Bromide-Olodaterol (STIOLTO RESPIMAT) 2.5-2.5 MCG/ACT AERS Inhale 2 puffs into the lungs daily. (Patient not taking: Reported on 01/03/2022) 1 each 5   tiZANidine (ZANAFLEX) 2 MG tablet Take by mouth. (Patient not taking: Reported on 01/03/2022)     No facility-administered medications prior to visit.     Review of Systems:   Constitutional:   No  weight loss, night sweats,  Fevers, chills, fatigue, or  lassitude.  HEENT:   No headaches,  Difficulty swallowing,  Tooth/dental  problems, or  Sore throat,                No sneezing, itching, ear ache, nasal congestion, post nasal drip,   CV:  No chest pain,  Orthopnea, PND, swelling in lower extremities, anasarca, dizziness, palpitations, syncope.   GI  No heartburn, indigestion, abdominal pain, nausea, vomiting, diarrhea, change in bowel habits, loss of appetite, bloody stools.   Resp: No shortness of breath with exertion or at rest.  No excess mucus, no productive cough,  No non-productive cough,  No coughing up of blood.  No  change in color of mucus.  No wheezing.  No chest wall deformity  Skin: no rash or lesions.  GU: no dysuria, change in color of urine, no urgency or frequency.  No flank pain, no hematuria   MS:  No joint pain or swelling.  No decreased range of motion.  No back pain.    Physical Exam  BP 102/62 (BP Location: Left Arm, Patient Position: Sitting, Cuff Size: Normal)   Pulse (!) 56   Temp 97.6 F (36.4 C) (Temporal)   Ht 5\' 6"  (1.676 m)   Wt 98 lb (44.5 kg)   SpO2 100%   BMI 15.82 kg/m   GEN: A/Ox3; pleasant , NAD, thin   HEENT:  [place]/AT,  , NOSE-clear, THROAT-clear, no lesions, no postnasal drip or exudate noted.  Edentulous  NECK:  Supple w/ fair ROM; no JVD; normal carotid impulses w/o bruits; no thyromegaly or nodules palpated; no lymphadenopathy.    RESP  Clear  P & A; w/o, wheezes/ rales/ or rhonchi. no accessory muscle use, no dullness to percussion  CARD:  RRR, no m/r/g, no peripheral edema, pulses intact, no cyanosis or clubbing.  GI:   Soft & nt; nml bowel sounds; no organomegaly or masses detected.   Musco: Warm bil, no deformities or joint swelling noted.   Neuro: alert, no focal deficits noted.    Skin: Warm, no lesions or rashes    Lab Results:      BNP No results found for: "BNP"  ProBNP No results found for: "PROBNP"  Imaging: No results found.        No data to display          No results found for: "NITRICOXIDE"      Assessment & Plan:   COPD (chronic obstructive pulmonary disease) ([place]) Appears currently stable.  Prevnar 20 vaccine today.  Smoking cessation discussed in detail  Plan  Patient Instructions  Mucinex DM twice daily as needed for cough and congestion Albuterol inhaler as needed Work on not smoking .  Prevnar 20 vaccine today .  Follow up with Dr. Valeta Harms in 4-6 months with PFT and As needed   Please contact office for sooner follow up if symptoms do not improve or worsen or seek emergency care    Lung  cancer [facility]) Squamous cell lung cancer-right upper lobe nodule status post SBRT  Continue follow-up with radiation oncology.  CT chest next month as planned  Protein calorie malnutrition (HCC) High-protein diet.     Rexene Edison, NP 03/11/2022

## 2022-03-11 NOTE — Assessment & Plan Note (Addendum)
Appears currently stable.  Prevnar 20 vaccine today.  Smoking cessation discussed in detail  Plan  Patient Instructions  Mucinex DM twice daily as needed for cough and congestion Albuterol inhaler as needed Work on not smoking .  Prevnar 20 vaccine today .  Follow up with Dr. Valeta Harms in 4-6 months with PFT and As needed   Please contact office for sooner follow up if symptoms do not improve or worsen or seek emergency care

## 2022-03-11 NOTE — Addendum Note (Signed)
Addended by: June Leap on: 03/11/2022 02:17 PM   Modules accepted: Orders

## 2022-03-11 NOTE — Addendum Note (Signed)
Addended by: June Leap on: 03/11/2022 02:19 PM   Modules accepted: Orders

## 2022-03-11 NOTE — Assessment & Plan Note (Signed)
High-protein diet 

## 2022-04-01 ENCOUNTER — Ambulatory Visit (HOSPITAL_COMMUNITY)
Admission: RE | Admit: 2022-04-01 | Discharge: 2022-04-01 | Disposition: A | Discharge status: Home / Self Care | Payer: Medicare HMO | Source: Ambulatory Visit | Attending: Radiation Oncology | Admitting: Radiation Oncology

## 2022-04-01 DIAGNOSIS — C3411 Malignant neoplasm of upper lobe, right bronchus or lung: Secondary | ICD-10-CM | POA: Diagnosis not present

## 2022-04-01 DIAGNOSIS — J439 Emphysema, unspecified: Secondary | ICD-10-CM | POA: Diagnosis not present

## 2022-04-01 DIAGNOSIS — R918 Other nonspecific abnormal finding of lung field: Secondary | ICD-10-CM | POA: Diagnosis not present

## 2022-04-01 DIAGNOSIS — J9811 Atelectasis: Secondary | ICD-10-CM | POA: Diagnosis not present

## 2022-04-01 DIAGNOSIS — C3490 Malignant neoplasm of unspecified part of unspecified bronchus or lung: Secondary | ICD-10-CM | POA: Diagnosis not present

## 2022-04-01 NOTE — Progress Notes (Signed)
[name] is here today for follow up post radiation to the lung.  Lung Side: Right lung. Patient completed radiation on 11/23/2021 - 11/30/2021  Does the patient complain of any of the following: Pain:No Shortness of breath w/wo exertion: No Cough: Yes-dry cough Hemoptysis: No Pain with swallowing: No Swallowing/choking concerns: No Appetite: Good  Weight:  Wt Readings from Last 3 Encounters:  04/07/22 95 lb 12.8 oz (43.5 kg)  03/11/22 98 lb (44.5 kg)  01/03/22 93 lb (42.2 kg)   Energy Level: good Post radiation skin Changes: No    Additional comments if applicable: Patient continues to smoke  3 cigarettes a day.  BP 115/68 (BP Location: Left Arm, Patient Position: Sitting, Cuff Size: Normal)   Pulse (!) 55   Temp 97.8 F (36.6 C)   Resp 20   Ht '5\' 6"'$  (1.676 m)   Wt 95 lb 12.8 oz (43.5 kg)   SpO2 100%   BMI 15.46 kg/m

## 2022-04-06 NOTE — Progress Notes (Signed)
Radiation Oncology         [phone] ________________________________  Name: [name] MRN: [id number]  Date: 04/07/2022  DOB: Feb 02, 1945  Follow-Up Visit Note  CC: Horald Pollen, MD  Garner Nash, DO  No diagnosis found.  Diagnosis: The encounter diagnosis was Malignant neoplasm of upper lobe of right lung ([place]).   Squamous cell carcinoma of the right upper lobe , Stage IA1 (cT1a, cN0, cM0)   Cancer Staging  Lung cancer [facility]) Staging form: Lung, AJCC 8th Edition - Clinical stage from 11/04/2021: Stage IA1 (cT1a, cN0, cM0) - Signed by Gery Pray, MD on 11/04/2021  Interval Since Last Radiation: 4 months and 9 days   Intent: Curative  Radiation Treatment Dates: 11/23/2021 through 11/30/2021 Site Technique Total Dose (Gy) Dose per Fx (Gy) Completed Fx Beam Energies  Lung, Right: Lung_R IMRT 54/54 18 3/3 6XFFF   Narrative:  The patient returns today for routine follow-up and to review recent imaging results. She was last seen here for follow-up on 01/03/22.           Since her last visit, the patient followed up with Dr. Juline Patch office on 03/11/22. During which time, the patient denied any worsening cough or other concerns. She will continue to use albuterol PRN.    Her most recent chest CT without contrast on 04/01/22 shows a decrease in size of the right basilar upper lobe nodule, measuring 8 x 4 mm, previously 10 x 9 mm. CT also shows interval stability of the smaller RUL nodule, measuring 7 x 5 mm, an underlying emphysematous lung changes which also remain stable. Imaging otherwise shows no new lesions or evidence of lymphadenopathy.             No other significant interval history since the patient was last seen for follow-up.  ***           Allergies:  has No Known Allergies.  Meds: Current Outpatient Medications  Medication Sig Dispense Refill   albuterol (VENTOLIN HFA) 108 (90 Base) MCG/ACT inhaler Inhale 1-2 puffs into the lungs every 6 (six)  hours as needed. (Patient not taking: Reported on 01/03/2022) 8 g 2   ibuprofen (ADVIL) 800 MG tablet Take 800 mg by mouth 2 (two) times daily as needed.     mirtazapine (REMERON) 7.5 MG tablet SMARTSIG:1 Tablet(s) By Mouth Every Evening (Patient not taking: Reported on 01/03/2022)     Multiple Vitamin (MULTIVITAMIN) tablet Take 1 tablet by mouth daily. (Patient not taking: Reported on 01/03/2022)     pramipexole (MIRAPEX) 0.25 MG tablet Take 0.25 mg by mouth at bedtime. (Patient not taking: Reported on 01/03/2022)     Tiotropium Bromide-Olodaterol (STIOLTO RESPIMAT) 2.5-2.5 MCG/ACT AERS Inhale 2 puffs into the lungs daily. (Patient not taking: Reported on 01/03/2022) 1 each 5   tiZANidine (ZANAFLEX) 2 MG tablet Take by mouth. (Patient not taking: Reported on 01/03/2022)     No current facility-administered medications for this encounter.    Physical Findings: The patient is in no acute distress. Patient is alert and oriented.  vitals were not taken for this visit. .  No significant changes. Lungs are clear to auscultation bilaterally. Heart has regular rate and rhythm. No palpable cervical, supraclavicular, or axillary adenopathy. Abdomen soft, non-tender, normal bowel sounds.   Lab Findings: Lab Results  Component Value Date   WBC 3.0 (L) 10/12/2021   HGB 12.8 10/12/2021   HCT 37.8 10/12/2021   MCV 99.7 10/12/2021   PLT 234 10/12/2021  Radiographic Findings: CT CHEST WO CONTRAST  Result Date: 04/04/2022 CLINICAL DATA:  Non-small-cell lung cancer. Radiation 2023. Assess treatment response. * Tracking Code: BO * EXAM: CT CHEST WITHOUT CONTRAST TECHNIQUE: Multidetector CT imaging of the chest was performed following the standard protocol without IV contrast. RADIATION DOSE REDUCTION: This exam was performed according to the departmental dose-optimization program which includes automated exposure control, adjustment of the mA and/or kV according to patient size and/or use of iterative  reconstruction technique. COMPARISON:  CT 10/12/2021 and older FINDINGS: Cardiovascular: Small pericardial effusion. The heart is nonenlarged. Coronary artery calcifications are seen. The thoracic aorta has a normal course and caliber with scattered atherosclerotic plaque. Mediastinum/Nodes: Mildly atrophic thyroid gland. No specific abnormal lymph node enlargement seen in the axillary region, hilum or mediastinum. Normal caliber thoracic esophagus. Lungs/Pleura: Left lung is grossly clear. No consolidation, pneumothorax or effusion. There are some paraseptal changes of the left lung apex. Slight breathing motion. Right lung has some dependent atelectasis in the medial right lower lobe. No consolidation, pneumothorax or effusion. Paraseptal and centrilobular emphysematous changes are seen. In the right upper lobe small nodular focus identified on series 7, image 53 measuring 7 x 5 mm, unchanged from previous exam. More caudal is a fiduciary marker also a subtle spiculated nodular area abutting the minor fissure. This nodule previously measured 10 x 9 mm and today on series 7, image 73 is smaller at 8 by 4 mm. Fiduciary marker just above this is new. Upper Abdomen: Along the upper abdomen the adrenal glands are preserved. Colonic diverticula. Musculoskeletal: Moderate curvature of the spine. Multilevel degenerative changes. There is also bony fusion across disc spaces in the thoracic spine at multiple levels. Osteopenia. IMPRESSION: Interval placement of fiduciary marker adjacent to the right basilar segment upper lobe nodule. The nodule of the smaller today. Smaller slightly spiculated nodule more superiorly is stable and could represent more scarring but recommend continued surveillance as part of the patient's lung neoplasm. Underlying emphysematous lung changes. No developing new mass lesion or lymph node enlargement Aortic Atherosclerosis (ICD10-I70.0) and Emphysema (ICD10-J43.9). Electronically Signed   By:  Jill Side M.D.   On: 04/04/2022 12:16    Impression:  The encounter diagnosis was Malignant neoplasm of upper lobe of right lung ([place]).   Squamous cell carcinoma of the right upper lobe , Stage IA1 (cT1a, cN0, cM0)  The patient is recovering from the effects of radiation.  ***  Plan:  ***   *** minutes of total time was spent for this patient encounter, including preparation, face-to-face counseling with the patient and coordination of care, physical exam, and documentation of the encounter. ____________________________________  Blair Promise, PhD, MD  This document serves as a record of services personally performed by Gery Pray, MD. It was created on his behalf by Roney Mans, a trained medical scribe. The creation of this record is based on the scribe's personal observations and the provider's statements to them. This document has been checked and approved by the attending provider.

## 2022-04-07 ENCOUNTER — Ambulatory Visit
Admission: RE | Admit: 2022-04-07 | Discharge: 2022-04-07 | Disposition: A | Discharge status: Home / Self Care | Payer: Medicare HMO | Source: Ambulatory Visit | Attending: Radiation Oncology | Admitting: Radiation Oncology

## 2022-04-07 ENCOUNTER — Encounter: Payer: Self-pay | Admitting: Radiation Oncology

## 2022-04-07 VITALS — BP 115/68 | HR 55 | Temp 97.8°F | Resp 20 | Ht 66.0 in | Wt 95.8 lb

## 2022-04-07 DIAGNOSIS — Z923 Personal history of irradiation: Secondary | ICD-10-CM | POA: Diagnosis not present

## 2022-04-07 DIAGNOSIS — F17210 Nicotine dependence, cigarettes, uncomplicated: Secondary | ICD-10-CM | POA: Diagnosis not present

## 2022-04-07 DIAGNOSIS — M8580 Other specified disorders of bone density and structure, unspecified site: Secondary | ICD-10-CM | POA: Insufficient documentation

## 2022-04-07 DIAGNOSIS — C3411 Malignant neoplasm of upper lobe, right bronchus or lung: Secondary | ICD-10-CM

## 2022-04-07 DIAGNOSIS — J432 Centrilobular emphysema: Secondary | ICD-10-CM | POA: Insufficient documentation

## 2022-04-07 DIAGNOSIS — R911 Solitary pulmonary nodule: Secondary | ICD-10-CM | POA: Diagnosis not present

## 2022-04-14 ENCOUNTER — Ambulatory Visit (INDEPENDENT_AMBULATORY_CARE_PROVIDER_SITE_OTHER): Payer: Medicare HMO | Admitting: Emergency Medicine

## 2022-04-14 ENCOUNTER — Encounter: Payer: Self-pay | Admitting: Emergency Medicine

## 2022-04-14 VITALS — BP 106/62 | HR 60 | Temp 98.5°F | Ht 66.0 in | Wt 95.2 lb

## 2022-04-14 DIAGNOSIS — E440 Moderate protein-calorie malnutrition: Secondary | ICD-10-CM | POA: Diagnosis not present

## 2022-04-14 DIAGNOSIS — Z7689 Persons encountering health services in other specified circumstances: Secondary | ICD-10-CM

## 2022-04-14 DIAGNOSIS — C3411 Malignant neoplasm of upper lobe, right bronchus or lung: Secondary | ICD-10-CM

## 2022-04-14 NOTE — Assessment & Plan Note (Signed)
Stable.  Recently finished radiation therapy.  Doing well. Follows up with oncology and pulmonary on a regular basis No concern is identified today.

## 2022-04-14 NOTE — Progress Notes (Signed)
[name] 77 y.o.   Chief Complaint  Patient presents with   New Patient (Initial Visit)    No concerns     HISTORY OF PRESENT ILLNESS: This is a 77 y.o. female first visit to this office, here to establish care with me. Accompanied by daughter. Patient has history of lung cancer treated with radiation treatment. Already established with pulmonary and oncology divisions.  Sees them on a regular basis. Has no complaints today or any other medical concerns.  HPI   Prior to Admission medications   Medication Sig Start Date End Date Taking? Authorizing Provider  ibuprofen (ADVIL) 800 MG tablet Take 800 mg by mouth 2 (two) times daily as needed. 08/13/21  Yes [provider]  mirtazapine (REMERON) 7.5 MG tablet SMARTSIG:1 Tablet(s) By Mouth Every Evening Patient not taking: Reported on 04/14/2022 08/13/21   [provider]  Multiple Vitamin (MULTIVITAMIN) tablet Take 1 tablet by mouth daily. Patient not taking: Reported on 01/03/2022    [provider]  pramipexole (MIRAPEX) 0.25 MG tablet Take 0.25 mg by mouth at bedtime. Patient not taking: Reported on 01/03/2022 08/13/21   [provider]  Tiotropium Bromide-Olodaterol (STIOLTO RESPIMAT) 2.5-2.5 MCG/ACT AERS Inhale 2 puffs into the lungs daily. Patient not taking: Reported on 01/03/2022 10/22/21   Parrett, Fonnie Mu, NP  tiZANidine (ZANAFLEX) 2 MG tablet Take by mouth. Patient not taking: Reported on 01/03/2022 04/04/13   [provider]    No Known Allergies  Patient Active Problem List   Diagnosis Date Noted   COPD (chronic obstructive pulmonary disease) ([place]) 03/11/2022   Protein calorie malnutrition ([place]) 11/19/2021   Lung cancer ([place]) 10/22/2021   Lung nodule 10/01/2021   BLADDER CANCER 12/11/2007   CONGENITAL NEUTROPENIA 12/19/2006   ALCOHOL ABUSE, HX OF 12/19/2006    Past Medical History:  Diagnosis Date   Bladder cancer ([place])    GERD (gastroesophageal reflux disease)     History of radiation therapy    Right Lung- 11/23/21-11/30/21- Dr. Gery Pray   Lung nodule    Tobacco use     Past Surgical History:  Procedure Laterality Date   BRONCHIAL BIOPSY  10/12/2021   Procedure: BRONCHIAL BIOPSIES;  Surgeon: Garner Nash, DO;  Location: [facility];  Service: Pulmonary;;   BRONCHIAL NEEDLE ASPIRATION BIOPSY  10/12/2021   Procedure: BRONCHIAL NEEDLE ASPIRATION BIOPSIES;  Surgeon: Garner Nash, DO;  Location: [facility];  Service: Pulmonary;;   CYSTOSCOPY     patient thinks at [place]  10/12/2021   Procedure: FIDUCIAL MARKER PLACEMENT;  Surgeon: Garner Nash, DO;  Location: [facility];  Service: Pulmonary;;    Social History   Socioeconomic History   Marital status: Divorced    Spouse name: Not on file   Number of children: Not on file   Years of education: Not on file   Highest education level: Not on file  Occupational History   Not on file  Tobacco Use   Smoking status: Every Day    Packs/day: 1.00    Years: 15.00    Additional pack years: 0.00    Total pack years: 15.00    Types: Cigarettes   Smokeless tobacco: Never   Tobacco comments:    No cigarettes in a week and a half.  11/19/21 hfb  Vaping Use   Vaping Use: Never used  Substance and Sexual Activity   Alcohol use: Yes    Alcohol/week: 14.0 standard drinks of alcohol  Types: 14 Cans of beer per week    Comment: weekly   Drug use: Never   Sexual activity: Not Currently    Birth control/protection: None  Other Topics Concern   Not on file  Social History Narrative   ** Merged History Encounter **       Social Determinants of Health   Financial Resource Strain: Not on file  Food Insecurity: Not on file  Transportation Needs: Not on file  Physical Activity: Not on file  Stress: Not on file  Social Connections: Not on file  Intimate Partner Violence: Not on file    Family History  Problem Relation Age of Onset   Arthritis Mother     Cancer Father        lung cancer   Cancer Paternal Uncle        lung cancer   ADD / ADHD Daughter    Breast cancer Neg Hx      Review of Systems  Constitutional: Negative.  Negative for chills and fever.  HENT: Negative.  Negative for congestion and sore throat.   Respiratory: Negative.  Negative for cough and shortness of breath.   Cardiovascular: Negative.  Negative for chest pain and palpitations.  Gastrointestinal:  Negative for abdominal pain, nausea and vomiting.  Genitourinary: Negative.  Negative for dysuria.  Skin: Negative.  Negative for rash.  Neurological:  Negative for dizziness and headaches.  All other systems reviewed and are negative.   Vitals:   04/14/22 1510  BP: 106/62  Pulse: 60  Temp: 98.5 F (36.9 C)  SpO2: 98%    Physical Exam Vitals reviewed.  Constitutional:      Appearance: Normal appearance.     Comments: Thin and slightly malnourished  HENT:     Head: Normocephalic.     Mouth/Throat:     Mouth: Mucous membranes are moist.     Pharynx: Oropharynx is clear.  Eyes:     Extraocular Movements: Extraocular movements intact.     Pupils: Pupils are equal, round, and reactive to light.  Cardiovascular:     Rate and Rhythm: Normal rate and regular rhythm.     Pulses: Normal pulses.     Heart sounds: Normal heart sounds.  Pulmonary:     Effort: Pulmonary effort is normal.     Breath sounds: Normal breath sounds.  Abdominal:     Palpations: Abdomen is soft.     Tenderness: There is no abdominal tenderness.  Musculoskeletal:     Cervical back: No tenderness.  Lymphadenopathy:     Cervical: No cervical adenopathy.  Skin:    General: Skin is warm and dry.     Capillary Refill: Capillary refill takes less than 2 seconds.  Neurological:     General: No focal deficit present.     Mental Status: She is alert and oriented to person, place, and time.  Psychiatric:        Mood and Affect: Mood normal.        Behavior: Behavior normal.       ASSESSMENT & PLAN: A total of 47 minutes was spent with the patient and counseling/coordination of care regarding preparing for this visit, review of available medical records, establishing care with me, comprehensive history and physical examination, review of chronic medical problems under management, review of all medications, education on nutrition, prognosis, documentation and need for follow-up.  Problem List Items Addressed This Visit       Respiratory   Lung cancer ([place]) - Primary  Stable.  Recently finished radiation therapy.  Doing well. Follows up with oncology and pulmonary on a regular basis No concern is identified today.        Other   Protein calorie malnutrition ([place])    Diet and nutrition discussed. Will refer to medical weight management.      Relevant Orders   Amb Ref to Medical Weight Management   Other Visit Diagnoses     Encounter to establish care          Patient Instructions  Health Maintenance After Age 36 After age 59, you are at a higher risk for certain long-term diseases and infections as well as injuries from falls. Falls are a major cause of broken bones and head injuries in people who are older than age 70. Getting regular preventive care can help to keep you healthy and well. Preventive care includes getting regular testing and making lifestyle changes as recommended by your health care provider. Talk with your health care provider about: Which screenings and tests you should have. A screening is a test that checks for a disease when you have no symptoms. A diet and exercise plan that is right for you. What should I know about screenings and tests to prevent falls? Screening and testing are the best ways to find a health problem early. Early diagnosis and treatment give you the best chance of managing medical conditions that are common after age 41. Certain conditions and lifestyle choices may make you more likely to have a fall. Your  health care provider may recommend: Regular vision checks. Poor vision and conditions such as cataracts can make you more likely to have a fall. If you wear glasses, make sure to get your prescription updated if your vision changes. Medicine review. Work with your health care provider to regularly review all of the medicines you are taking, including over-the-counter medicines. Ask your health care provider about any side effects that may make you more likely to have a fall. Tell your health care provider if any medicines that you take make you feel dizzy or sleepy. Strength and balance checks. Your health care provider may recommend certain tests to check your strength and balance while standing, walking, or changing positions. Foot health exam. Foot pain and numbness, as well as not wearing proper footwear, can make you more likely to have a fall. Screenings, including: Osteoporosis screening. Osteoporosis is a condition that causes the bones to get weaker and break more easily. Blood pressure screening. Blood pressure changes and medicines to control blood pressure can make you feel dizzy. Depression screening. You may be more likely to have a fall if you have a fear of falling, feel depressed, or feel unable to do activities that you used to do. Alcohol use screening. Using too much alcohol can affect your balance and may make you more likely to have a fall. Follow these instructions at home: Lifestyle Do not drink alcohol if: Your health care provider tells you not to drink. If you drink alcohol: Limit how much you have to: 0-1 drink a day for women. 0-2 drinks a day for men. Know how much alcohol is in your drink. In the [place] one drink equals one 12 oz bottle of beer (355 mL), one 5 oz glass of wine (148 mL), or one 1 oz glass of hard liquor (44 mL). Do not use any products that contain nicotine or tobacco. These products include cigarettes, chewing tobacco, and vaping devices, such as  e-cigarettes. If  you need help quitting, ask your health care provider. Activity  Follow a regular exercise program to stay fit. This will help you maintain your balance. Ask your health care provider what types of exercise are appropriate for you. If you need a cane or walker, use it as recommended by your health care provider. Wear supportive shoes that have nonskid soles. Safety  Remove any tripping hazards, such as rugs, cords, and clutter. Install safety equipment such as grab bars in bathrooms and safety rails on stairs. Keep rooms and walkways well-lit. General instructions Talk with your health care provider about your risks for falling. Tell your health care provider if: You fall. Be sure to tell your health care provider about all falls, even ones that seem minor. You feel dizzy, tiredness (fatigue), or off-balance. Take over-the-counter and prescription medicines only as told by your health care provider. These include supplements. Eat a healthy diet and maintain a healthy weight. A healthy diet includes low-fat dairy products, low-fat (lean) meats, and fiber from whole grains, beans, and lots of fruits and vegetables. Stay current with your vaccines. Schedule regular health, dental, and eye exams. Summary Having a healthy lifestyle and getting preventive care can help to protect your health and wellness after age 41. Screening and testing are the best way to find a health problem early and help you avoid having a fall. Early diagnosis and treatment give you the best chance for managing medical conditions that are more common for people who are older than age 84. Falls are a major cause of broken bones and head injuries in people who are older than age 61. Take precautions to prevent a fall at home. Work with your health care provider to learn what changes you can make to improve your health and wellness and to prevent falls. This information is not intended to replace advice given  to you by your health care provider. Make sure you discuss any questions you have with your health care provider. Document Revised: 06/08/2020 Document Reviewed: 06/08/2020 Elsevier Patient Education  [place], MD [facility]

## 2022-04-14 NOTE — Patient Instructions (Signed)
Health Maintenance After Age 79 After age 79, you are at a higher risk for certain long-term diseases and infections as well as injuries from falls. Falls are a major cause of broken bones and head injuries in people who are older than age 79. Getting regular preventive care can help to keep you healthy and well. Preventive care includes getting regular testing and making lifestyle changes as recommended by your health care provider. Talk with your health care provider about: Which screenings and tests you should have. A screening is a test that checks for a disease when you have no symptoms. A diet and exercise plan that is right for you. What should I know about screenings and tests to prevent falls? Screening and testing are the best ways to find a health problem early. Early diagnosis and treatment give you the best chance of managing medical conditions that are common after age 79. Certain conditions and lifestyle choices may make you more likely to have a fall. Your health care provider may recommend: Regular vision checks. Poor vision and conditions such as cataracts can make you more likely to have a fall. If you wear glasses, make sure to get your prescription updated if your vision changes. Medicine review. Work with your health care provider to regularly review all of the medicines you are taking, including over-the-counter medicines. Ask your health care provider about any side effects that may make you more likely to have a fall. Tell your health care provider if any medicines that you take make you feel dizzy or sleepy. Strength and balance checks. Your health care provider may recommend certain tests to check your strength and balance while standing, walking, or changing positions. Foot health exam. Foot pain and numbness, as well as not wearing proper footwear, can make you more likely to have a fall. Screenings, including: Osteoporosis screening. Osteoporosis is a condition that causes  the bones to get weaker and break more easily. Blood pressure screening. Blood pressure changes and medicines to control blood pressure can make you feel dizzy. Depression screening. You may be more likely to have a fall if you have a fear of falling, feel depressed, or feel unable to do activities that you used to do. Alcohol use screening. Using too much alcohol can affect your balance and may make you more likely to have a fall. Follow these instructions at home: Lifestyle Do not drink alcohol if: Your health care provider tells you not to drink. If you drink alcohol: Limit how much you have to: 0-1 drink a day for women. 0-2 drinks a day for men. Know how much alcohol is in your drink. In the [place] one drink equals one 12 oz bottle of beer (355 mL), one 5 oz glass of wine (148 mL), or one 1 oz glass of hard liquor (44 mL). Do not use any products that contain nicotine or tobacco. These products include cigarettes, chewing tobacco, and vaping devices, such as e-cigarettes. If you need help quitting, ask your health care provider. Activity  Follow a regular exercise program to stay fit. This will help you maintain your balance. Ask your health care provider what types of exercise are appropriate for you. If you need a cane or walker, use it as recommended by your health care provider. Wear supportive shoes that have nonskid soles. Safety  Remove any tripping hazards, such as rugs, cords, and clutter. Install safety equipment such as grab bars in bathrooms and safety rails on stairs. Keep rooms and walkways   well-lit. General instructions Talk with your health care provider about your risks for falling. Tell your health care provider if: You fall. Be sure to tell your health care provider about all falls, even ones that seem minor. You feel dizzy, tiredness (fatigue), or off-balance. Take over-the-counter and prescription medicines only as told by your health care provider. These include  supplements. Eat a healthy diet and maintain a healthy weight. A healthy diet includes low-fat dairy products, low-fat (lean) meats, and fiber from whole grains, beans, and lots of fruits and vegetables. Stay current with your vaccines. Schedule regular health, dental, and eye exams. Summary Having a healthy lifestyle and getting preventive care can help to protect your health and wellness after age 79. Screening and testing are the best way to find a health problem early and help you avoid having a fall. Early diagnosis and treatment give you the best chance for managing medical conditions that are more common for people who are older than age 79. Falls are a major cause of broken bones and head injuries in people who are older than age 79. Take precautions to prevent a fall at home. Work with your health care provider to learn what changes you can make to improve your health and wellness and to prevent falls. This information is not intended to replace advice given to you by your health care provider. Make sure you discuss any questions you have with your health care provider. Document Revised: 06/08/2020 Document Reviewed: 06/08/2020 Elsevier Patient Education  2023 Elsevier Inc[employer]  

## 2022-04-14 NOTE — Assessment & Plan Note (Signed)
Diet and nutrition discussed. Will refer to medical weight management.

## 2022-04-20 DIAGNOSIS — Z681 Body mass index (BMI) 19 or less, adult: Secondary | ICD-10-CM | POA: Diagnosis not present

## 2022-04-20 DIAGNOSIS — Z91011 Allergy to milk products: Secondary | ICD-10-CM | POA: Diagnosis not present

## 2022-04-20 DIAGNOSIS — Z85118 Personal history of other malignant neoplasm of bronchus and lung: Secondary | ICD-10-CM | POA: Diagnosis not present

## 2022-04-20 DIAGNOSIS — M1990 Unspecified osteoarthritis, unspecified site: Secondary | ICD-10-CM | POA: Diagnosis not present

## 2022-04-20 DIAGNOSIS — F17200 Nicotine dependence, unspecified, uncomplicated: Secondary | ICD-10-CM | POA: Diagnosis not present

## 2022-04-20 DIAGNOSIS — F329 Major depressive disorder, single episode, unspecified: Secondary | ICD-10-CM | POA: Diagnosis not present

## 2022-04-20 DIAGNOSIS — E46 Unspecified protein-calorie malnutrition: Secondary | ICD-10-CM | POA: Diagnosis not present

## 2022-04-20 DIAGNOSIS — G2581 Restless legs syndrome: Secondary | ICD-10-CM | POA: Diagnosis not present

## 2022-05-11 ENCOUNTER — Other Ambulatory Visit: Payer: Self-pay | Admitting: Internal Medicine

## 2022-05-11 DIAGNOSIS — Z0000 Encounter for general adult medical examination without abnormal findings: Secondary | ICD-10-CM

## 2022-06-17 ENCOUNTER — Ambulatory Visit
Admission: RE | Admit: 2022-06-17 | Discharge: 2022-06-17 | Disposition: A | Discharge status: Home / Self Care | Payer: Medicare HMO | Source: Ambulatory Visit | Attending: Internal Medicine | Admitting: Internal Medicine

## 2022-06-17 DIAGNOSIS — Z0000 Encounter for general adult medical examination without abnormal findings: Secondary | ICD-10-CM

## 2022-06-17 DIAGNOSIS — Z1231 Encounter for screening mammogram for malignant neoplasm of breast: Secondary | ICD-10-CM | POA: Diagnosis not present

## 2022-08-02 ENCOUNTER — Ambulatory Visit: Payer: Medicare HMO

## 2022-09-29 ENCOUNTER — Other Ambulatory Visit: Payer: Self-pay

## 2022-09-29 DIAGNOSIS — R911 Solitary pulmonary nodule: Secondary | ICD-10-CM

## 2022-09-29 DIAGNOSIS — Z720 Tobacco use: Secondary | ICD-10-CM

## 2022-09-29 DIAGNOSIS — J449 Chronic obstructive pulmonary disease, unspecified: Secondary | ICD-10-CM

## 2022-09-30 ENCOUNTER — Ambulatory Visit: Payer: Medicare HMO | Admitting: Pulmonary Disease

## 2022-09-30 ENCOUNTER — Telehealth: Payer: Self-pay | Admitting: *Deleted

## 2022-09-30 DIAGNOSIS — J449 Chronic obstructive pulmonary disease, unspecified: Secondary | ICD-10-CM | POA: Diagnosis not present

## 2022-09-30 DIAGNOSIS — Z720 Tobacco use: Secondary | ICD-10-CM

## 2022-09-30 DIAGNOSIS — R911 Solitary pulmonary nodule: Secondary | ICD-10-CM

## 2022-09-30 LAB — PULMONARY FUNCTION TEST
DL/VA % pred: 63 %
DL/VA: 2.55 ml/min/mmHg/L
DLCO cor % pred: 51 %
DLCO cor: 10.43 ml/min/mmHg
DLCO unc % pred: 51 %
DLCO unc: 10.43 ml/min/mmHg
FEF 25-75 Post: 0.7 L/s
FEF 25-75 Pre: 0.65 L/s
FEF2575-%Change-Post: 7 %
FEF2575-%Pred-Post: 44 %
FEF2575-%Pred-Pre: 41 %
FEV1-%Change-Post: 8 %
FEV1-%Pred-Post: 52 %
FEV1-%Pred-Pre: 48 %
FEV1-Post: 1.14 L
FEV1-Pre: 1.05 L
FEV1FVC-%Change-Post: 14 %
FEV1FVC-%Pred-Pre: 65 %
FEV6-%Change-Post: -4 %
FEV6-%Pred-Post: 74 %
FEV6-%Pred-Pre: 78 %
FEV6-Post: 2.06 L
FEV6-Pre: 2.16 L
FEV6FVC-%Pred-Post: 105 %
FEV6FVC-%Pred-Pre: 105 %
FVC-%Change-Post: -4 %
FVC-%Pred-Post: 70 %
FVC-%Pred-Pre: 74 %
FVC-Post: 2.06 L
FVC-Pre: 2.16 L
Post FEV1/FVC ratio: 55 %
Post FEV6/FVC ratio: 100 %
Pre FEV1/FVC ratio: 49 %
Pre FEV6/FVC Ratio: 100 %
RV % pred: 113 %
RV: 2.81 L
TLC % pred: 88 %
TLC: 4.76 L

## 2022-09-30 NOTE — Telephone Encounter (Signed)
CALLED PATIENT TO INFORM OF CT FOR 10-07-22- ARRIVAL TIME- 4:45 PM @ [facility], NO RESTRICTIONS TO SCAN, PATIENT TO RECEIVE RESULTS FROM DR. KINARD ON 10-10-22 @ 3 PM, LVM FOR A RETURN CALL

## 2022-09-30 NOTE — Patient Instructions (Signed)
Full PFT performed today. °

## 2022-09-30 NOTE — Progress Notes (Signed)
Full PFT performed today. °

## 2022-10-07 ENCOUNTER — Ambulatory Visit (HOSPITAL_COMMUNITY)
Admission: RE | Admit: 2022-10-07 | Discharge: 2022-10-07 | Disposition: A | Discharge status: Home / Self Care | Payer: Medicare HMO | Source: Ambulatory Visit | Attending: Radiology | Admitting: Radiology

## 2022-10-07 ENCOUNTER — Encounter: Payer: Self-pay | Admitting: Pulmonary Disease

## 2022-10-07 ENCOUNTER — Ambulatory Visit (INDEPENDENT_AMBULATORY_CARE_PROVIDER_SITE_OTHER): Payer: Medicare HMO | Admitting: Pulmonary Disease

## 2022-10-07 VITALS — BP 100/50 | HR 54 | Ht 66.0 in | Wt 98.0 lb

## 2022-10-07 DIAGNOSIS — R911 Solitary pulmonary nodule: Secondary | ICD-10-CM

## 2022-10-07 DIAGNOSIS — J439 Emphysema, unspecified: Secondary | ICD-10-CM | POA: Diagnosis not present

## 2022-10-07 DIAGNOSIS — C3411 Malignant neoplasm of upper lobe, right bronchus or lung: Secondary | ICD-10-CM | POA: Diagnosis not present

## 2022-10-07 DIAGNOSIS — E43 Unspecified severe protein-calorie malnutrition: Secondary | ICD-10-CM

## 2022-10-07 DIAGNOSIS — Z720 Tobacco use: Secondary | ICD-10-CM | POA: Diagnosis not present

## 2022-10-07 DIAGNOSIS — I700 Atherosclerosis of aorta: Secondary | ICD-10-CM | POA: Diagnosis not present

## 2022-10-07 DIAGNOSIS — C3490 Malignant neoplasm of unspecified part of unspecified bronchus or lung: Secondary | ICD-10-CM | POA: Diagnosis not present

## 2022-10-07 DIAGNOSIS — J449 Chronic obstructive pulmonary disease, unspecified: Secondary | ICD-10-CM | POA: Diagnosis not present

## 2022-10-07 MED ORDER — TRELEGY ELLIPTA 100-62.5-25 MCG/ACT IN AEPB: 1.0000 | INHALATION_SPRAY | Freq: Every day | RESPIRATORY_TRACT | 6 refills | Status: AC

## 2022-10-07 MED ORDER — ALBUTEROL SULFATE HFA 108 (90 BASE) MCG/ACT IN AERS: 2.0000 | INHALATION_SPRAY | Freq: Four times a day (QID) | RESPIRATORY_TRACT | 6 refills | Status: AC | PRN

## 2022-10-07 MED ORDER — TRELEGY ELLIPTA 100-62.5-25 MCG/ACT IN AEPB: 1.0000 | INHALATION_SPRAY | Freq: Every day | RESPIRATORY_TRACT | Status: DC

## 2022-10-07 NOTE — Patient Instructions (Signed)
Thank you for visiting Dr. Tonia Brooms at [facility]. Today we recommend the following:  Meds ordered this encounter  Medications   Fluticasone-Umeclidin-Vilant (TRELEGY ELLIPTA) 100-62.5-25 MCG/ACT AEPB    Sig: Inhale 1 puff into the lungs daily.    Dispense:  60 each    Refill:  6   albuterol (VENTOLIN HFA) 108 (90 Base) MCG/ACT inhaler    Sig: Inhale 2 puffs into the lungs every 6 (six) hours as needed for wheezing or shortness of breath.    Dispense:  8 g    Refill:  6   Return in about 6 months (around 04/06/2023) for with APP.    Please do your part to reduce the spread of COVID-19.

## 2022-10-07 NOTE — Addendum Note (Signed)
Addended by: Hedda Slade on: 10/07/2022 04:50 PM   Modules accepted: Orders

## 2022-10-07 NOTE — Progress Notes (Signed)
Synopsis: Referred in September 2023 for lung nodule by Georgina Quint, *  Subjective:   PATIENT ID: [name]: female DOB: 03-Nov-1945, MRN: [id number]  Chief Complaint  Patient presents with   Follow-up    F/up on PFT    This is a 77 year old female, past medical history of COPD, bladder cancer, alcohol use. Patient had a CT scan of the chest on 09/02/2021 this revealed a spiculated 1 cm nodule in the right upper lobe concerning for malignancy.  Patient was referred here for consideration of next steps.  Patient has no respiratory complaints.  She is a longstanding history of tobacco use.  She has smoked for 50+ years since the age of 67.  She has no cough no fever no shortness of breath.  Denies hemoptysis.  OV 10/07/2022: Originally patient was seen for spiculated pulmonary nodule diagnosed with malignancy and referred to radiation oncology for SBRT.  Unfortunately she is still smoking.  She had pulmonary function test complete last week that shows stage II COPD with an FEV1 of 52% predicted.  He is currently using no inhalers.  Unfortunately she is still using cigarettes daily.    Past Medical History:  Diagnosis Date   Bladder cancer (HCC)    GERD (gastroesophageal reflux disease)    History of radiation therapy    Right Lung- 11/23/21-11/30/21- Dr. Antony Blackbird   Lung nodule    Tobacco use      Family History  Problem Relation Age of Onset   Arthritis Mother    Cancer Father        lung cancer   Cancer Paternal Uncle        lung cancer   ADD / ADHD Daughter    Breast cancer Neg Hx      Past Surgical History:  Procedure Laterality Date   BRONCHIAL BIOPSY  10/12/2021   Procedure: BRONCHIAL BIOPSIES;  Surgeon: Josephine Igo, DO;  Location: [facility];  Service: Pulmonary;;   BRONCHIAL NEEDLE ASPIRATION BIOPSY  10/12/2021   Procedure: BRONCHIAL NEEDLE ASPIRATION BIOPSIES;  Surgeon: Josephine Igo, DO;  Location: [facility];  Service: Pulmonary;;    CYSTOSCOPY     [facility]   FIDUCIAL MARKER PLACEMENT  10/12/2021   Procedure: FIDUCIAL MARKER PLACEMENT;  Surgeon: Josephine Igo, DO;  Location: [facility];  Service: Pulmonary;;    Social History   Socioeconomic History   Marital status: Divorced    Spouse name: Not on file   Number of children: Not on file   Years of education: Not on file   Highest education level: Not on file  Occupational History   Not on file  Tobacco Use   Smoking status: Every Day    Current packs/day: 1.00    Average packs/day: 1 pack/day for 15.0 years (15.0 ttl pk-yrs)    Types: Cigarettes   Smokeless tobacco: Never   Tobacco comments:    No cigarettes in a week and a half.  10/07/2022 Tay  Vaping Use   Vaping status: Never Used  Substance and Sexual Activity   Alcohol use: Yes    Alcohol/week: 14.0 standard drinks of alcohol    Types: 14 Cans of beer per week    Comment: weekly   Drug use: Never   Sexual activity: Not Currently    Birth control/protection: None  Other Topics Concern   Not on file  Social History Narrative   ** Merged History Encounter **  Social Determinants of Health   Financial Resource Strain: Not on file  Food Insecurity: Not on file  Transportation Needs: Not on file  Physical Activity: Not on file  Stress: Not on file  Social Connections: Not on file  Intimate Partner Violence: Not on file     No Known Allergies   Outpatient Medications Prior to Visit  Medication Sig Dispense Refill   ibuprofen (ADVIL) 800 MG tablet Take 800 mg by mouth 2 (two) times daily as needed.     mirtazapine (REMERON) 7.5 MG tablet SMARTSIG:1 Tablet(s) By Mouth Every Evening (Patient not taking: Reported on 04/14/2022)     Multiple Vitamin (MULTIVITAMIN) tablet Take 1 tablet by mouth daily. (Patient not taking: Reported on 01/03/2022)     pramipexole (MIRAPEX) 0.25 MG tablet Take 0.25 mg by mouth at bedtime. (Patient not taking: Reported on 01/03/2022)      Tiotropium Bromide-Olodaterol (STIOLTO RESPIMAT) 2.5-2.5 MCG/ACT AERS Inhale 2 puffs into the lungs daily. (Patient not taking: Reported on 01/03/2022) 1 each 5   tiZANidine (ZANAFLEX) 2 MG tablet Take by mouth. (Patient not taking: Reported on 01/03/2022)     No facility-administered medications prior to visit.    Review of Systems  Constitutional:  Negative for chills, fever, malaise/fatigue and weight loss.  HENT:  Negative for hearing loss, sore throat and tinnitus.   Eyes:  Negative for blurred vision and double vision.  Respiratory:  Positive for cough and shortness of breath. Negative for hemoptysis, sputum production, wheezing and stridor.   Cardiovascular:  Negative for chest pain, palpitations, orthopnea, leg swelling and PND.  Gastrointestinal:  Negative for abdominal pain, constipation, diarrhea, heartburn, nausea and vomiting.  Genitourinary:  Negative for dysuria, hematuria and urgency.  Musculoskeletal:  Negative for joint pain and myalgias.  Skin:  Negative for itching and rash.  Neurological:  Negative for dizziness, tingling, weakness and headaches.  Endo/Heme/Allergies:  Negative for environmental allergies. Does not bruise/bleed easily.  Psychiatric/Behavioral:  Negative for depression. The patient is not nervous/anxious and does not have insomnia.   All other systems reviewed and are negative.    Objective:  Physical Exam Vitals reviewed.  Constitutional:      General: She is not in acute distress.    Appearance: She is well-developed.  HENT:     Head: Normocephalic and atraumatic.     Mouth/Throat:     Pharynx: No oropharyngeal exudate.  Eyes:     Conjunctiva/sclera: Conjunctivae normal.     Pupils: Pupils are equal, round, and reactive to light.  Neck:     Vascular: No JVD.     Trachea: No tracheal deviation.     Comments: Loss of supraclavicular fat Cardiovascular:     Rate and Rhythm: Normal rate and regular rhythm.     Heart sounds: S1 normal and S2  normal.     Comments: Distant heart tones Pulmonary:     Effort: No tachypnea or accessory muscle usage.     Breath sounds: No stridor. Decreased breath sounds (throughout all lung fields) present. No wheezing, rhonchi or rales.  Abdominal:     General: Bowel sounds are normal. There is no distension.     Palpations: Abdomen is soft.     Tenderness: There is no abdominal tenderness.  Musculoskeletal:        General: Deformity (muscle wasting ) present.  Skin:    General: Skin is warm and dry.     Capillary Refill: Capillary refill takes less than 2 seconds.  Findings: No rash.  Neurological:     Mental Status: She is alert and oriented to person, place, and time.  Psychiatric:        Behavior: Behavior normal.      Vitals:   10/07/22 1357  BP: (!) 100/50  Pulse: (!) 54  SpO2: 100%  Weight: 98 lb (44.5 kg)  Height: 5\' 6"  (1.676 m)   100% on RA BMI Readings from Last 3 Encounters:  10/07/22 15.82 kg/m  04/14/22 15.37 kg/m  04/07/22 15.46 kg/m   Wt Readings from Last 3 Encounters:  10/07/22 98 lb (44.5 kg)  04/14/22 95 lb 4 oz (43.2 kg)  04/07/22 95 lb 12.8 oz (43.5 kg)     CBC    Component Value Date/Time   WBC 3.0 (L) 10/12/2021 0754   RBC 3.79 (L) 10/12/2021 0754   HGB 12.8 10/12/2021 0754   HCT 37.8 10/12/2021 0754   PLT 234 10/12/2021 0754   MCV 99.7 10/12/2021 0754   MCH 33.8 10/12/2021 0754   MCHC 33.9 10/12/2021 0754   RDW 14.1 10/12/2021 0754   LYMPHSABS 1.9 11/13/2007 2326   MONOABS 0.2 11/13/2007 2326   EOSABS 0.1 11/13/2007 2326   BASOSABS 0.0 11/13/2007 2326    Chest Imaging: 09/02/2021 CT chest: 1 cm right upper lobe spiculated pulmonary nodule concerning for malignancy. The patient's images have been independently reviewed by me.    Pulmonary Functions Testing Results:    Latest Ref Rng & Units 09/29/2022    4:38 PM  PFT Results  FVC-Pre L 2.16  P  FVC-Predicted Pre % 74  P  FVC-Post L 2.06  P  FVC-Predicted Post % 70  P  Pre  FEV1/FVC % % 49  P  Post FEV1/FCV % % 55  P  FEV1-Pre L 1.05  P  FEV1-Predicted Pre % 48  P  FEV1-Post L 1.14  P  DLCO uncorrected ml/min/mmHg 10.43  P  DLCO UNC% % 51  P  DLCO corrected ml/min/mmHg 10.43  P  DLCO COR %Predicted % 51  P  DLVA Predicted % 63  P  TLC L 4.76  P  TLC % Predicted % 88  P  RV % Predicted % 113  P    P Preliminary result    FeNO:   Pathology:   Echocardiogram:   Heart Catheterization:     Assessment & Plan:     ICD-10-CM   1. Chronic obstructive pulmonary disease, unspecified COPD type (HCC)  J44.9     2. Lung nodule  R91.1     3. Tobacco use  Z72.0     4. Malignant neoplasm of upper lobe of right lung (HCC)  C34.11     5. Severe protein-calorie malnutrition (HCC)  E43       Discussion:  This is a 77 year old female, longstanding history of tobacco use found to have a spiculated nodule underwent bronchoscopy diagnosed with malignancy.  Sent for referral to radiation oncology and has completed SBRT.  She is doing well since then.  Pulmonary function test completed that shows stage II COPD with an FEV1 of 52% predicted currently not on any inhalers.  Unfortunately still smoking.  Plan: Ongoing smoking with stage II COPD puts her at risk for recurrent exacerbations without maintenance inhalers. I have started her on Trelegy once daily. Counseled today heavily on smoking cessation. She has CT scheduled follow-up with radiation oncology to look for any evidence of recurrence.  Her last CT appeared stable but it does  need nodule that is a high suspicion that we will continue to have follow-up.  Return to clinic to see [place] for COPD management in 6 months.     Current Outpatient Medications:    albuterol (VENTOLIN HFA) 108 (90 Base) MCG/ACT inhaler, Inhale 2 puffs into the lungs every 6 (six) hours as needed for wheezing or shortness of breath., Disp: 8 g, Rfl: 6   Fluticasone-Umeclidin-Vilant (TRELEGY ELLIPTA) 100-62.5-25 MCG/ACT AEPB,  Inhale 1 puff into the lungs daily., Disp: 60 each, Rfl: 6   ibuprofen (ADVIL) 800 MG tablet, Take 800 mg by mouth 2 (two) times daily as needed., Disp: , Rfl:    mirtazapine (REMERON) 7.5 MG tablet, SMARTSIG:1 Tablet(s) By Mouth Every Evening (Patient not taking: Reported on 04/14/2022), Disp: , Rfl:    Multiple Vitamin (MULTIVITAMIN) tablet, Take 1 tablet by mouth daily. (Patient not taking: Reported on 01/03/2022), Disp: , Rfl:    pramipexole (MIRAPEX) 0.25 MG tablet, Take 0.25 mg by mouth at bedtime. (Patient not taking: Reported on 01/03/2022), Disp: , Rfl:    Tiotropium Bromide-Olodaterol (STIOLTO RESPIMAT) 2.5-2.5 MCG/ACT AERS, Inhale 2 puffs into the lungs daily. (Patient not taking: Reported on 01/03/2022), Disp: 1 each, Rfl: 5   tiZANidine (ZANAFLEX) 2 MG tablet, Take by mouth. (Patient not taking: Reported on 01/03/2022), Disp: , Rfl:     Josephine Igo, DO [facility] 10/07/2022 2:19 PM

## 2022-10-09 NOTE — Progress Notes (Signed)
Radiation Oncology         (336) [phone] ________________________________  Name: [name] MRN: [id number]  Date: 10/10/2022  DOB: May 10, 1945  Follow-Up Visit Note  CC: Georgina Quint, MD  Josephine Igo, DO  No diagnosis found.  Diagnosis:  The encounter diagnosis was Malignant neoplasm of upper lobe of right lung (HCC).   Squamous cell carcinoma of the right upper lobe , Stage IA1 (cT1a, cN0, cM0)   Cancer Staging  Lung cancer [facility]) Staging form: Lung, AJCC 8th Edition - Clinical stage from 11/04/2021: Stage IA1 (cT1a, cN0, cM0) - Signed by Antony Blackbird, MD on 11/04/2021  Interval Since Last Radiation: 10 months and 8 days   Intent: Curative  Radiation Treatment Dates: 11/23/2021 through 11/30/2021 Site Technique Total Dose (Gy) Dose per Fx (Gy) Completed Fx Beam Energies  Lung, Right: Lung_R IMRT 54/54 18 3/3 6XFFF    Narrative:  The patient returns today for routine follow-up and to review recent imaging. She was last seen here for follow-up on 04/07/22.      Her most recent chest CT performed on 10/07/22 demonstrates: ***.  Since her last visit, the patient followed up with Dr. Tonia Brooms on 10/07/22. Her recent PFT results were reviewed at that time (performed the week prior) which showed stage II COPD with an FEV1 of 52% predicted. She was also noted to be still smoking and was counseled on smoking cessation. Dr. Tonia Brooms has also started her on trelegy once daily.         Other pertinent imaging performed in the interval includes a bilateral screening mammogram on 06/17/22 which showed no evidence of malignancy in either breast.      ***                  Allergies:  has No Known Allergies.  Meds: Current Outpatient Medications  Medication Sig Dispense Refill   albuterol (VENTOLIN HFA) 108 (90 Base) MCG/ACT inhaler Inhale 2 puffs into the lungs every 6 (six) hours as needed for wheezing or shortness of breath. 8 g 6   Fluticasone-Umeclidin-Vilant (TRELEGY  ELLIPTA) 100-62.5-25 MCG/ACT AEPB Inhale 1 puff into the lungs daily. 60 each 6   Fluticasone-Umeclidin-Vilant (TRELEGY ELLIPTA) 100-62.5-25 MCG/ACT AEPB Inhale 1 each into the lungs daily.     ibuprofen (ADVIL) 800 MG tablet Take 800 mg by mouth 2 (two) times daily as needed.     mirtazapine (REMERON) 7.5 MG tablet SMARTSIG:1 Tablet(s) By Mouth Every Evening (Patient not taking: Reported on 04/14/2022)     Multiple Vitamin (MULTIVITAMIN) tablet Take 1 tablet by mouth daily. (Patient not taking: Reported on 01/03/2022)     pramipexole (MIRAPEX) 0.25 MG tablet Take 0.25 mg by mouth at bedtime. (Patient not taking: Reported on 01/03/2022)     Tiotropium Bromide-Olodaterol (STIOLTO RESPIMAT) 2.5-2.5 MCG/ACT AERS Inhale 2 puffs into the lungs daily. (Patient not taking: Reported on 01/03/2022) 1 each 5   tiZANidine (ZANAFLEX) 2 MG tablet Take by mouth. (Patient not taking: Reported on 01/03/2022)     No current facility-administered medications for this encounter.    Physical Findings: The patient is in no acute distress. Patient is alert and oriented.  vitals were not taken for this visit. .  No significant changes. Lungs are clear to auscultation bilaterally. Heart has regular rate and rhythm. No palpable cervical, supraclavicular, or axillary adenopathy. Abdomen soft, non-tender, normal bowel sounds.   Lab Findings: Lab Results  Component Value Date   WBC 3.0 (L) 10/12/2021  HGB 12.8 10/12/2021   HCT 37.8 10/12/2021   MCV 99.7 10/12/2021   PLT 234 10/12/2021    Radiographic Findings: No results found.  Impression: The encounter diagnosis was Malignant neoplasm of upper lobe of right lung (HCC).   Squamous cell carcinoma of the right upper lobe , Stage IA1 (cT1a, cN0, cM0)  The patient is recovering from the effects of radiation.  ***  Plan:  ***   *** minutes of total time was spent for this patient encounter, including preparation, face-to-face counseling with the patient and  coordination of care, physical exam, and documentation of the encounter. ____________________________________  Billie Lade, PhD, MD  This document serves as a record of services personally performed by Antony Blackbird, MD. It was created on his behalf by Neena Rhymes, a trained medical scribe. The creation of this record is based on the scribe's personal observations and the provider's statements to them. This document has been checked and approved by the attending provider.

## 2022-10-10 ENCOUNTER — Ambulatory Visit
Admission: RE | Admit: 2022-10-10 | Discharge: 2022-10-10 | Disposition: A | Discharge status: Home / Self Care | Payer: Medicare HMO | Source: Ambulatory Visit | Attending: Radiation Oncology | Admitting: Radiation Oncology

## 2022-10-10 ENCOUNTER — Encounter: Payer: Self-pay | Admitting: Radiation Oncology

## 2022-10-10 VITALS — BP 110/69 | HR 3 | Temp 97.5°F | Resp 18 | Ht 66.0 in | Wt 101.0 lb

## 2022-10-10 DIAGNOSIS — Z923 Personal history of irradiation: Secondary | ICD-10-CM | POA: Diagnosis not present

## 2022-10-10 DIAGNOSIS — J439 Emphysema, unspecified: Secondary | ICD-10-CM | POA: Insufficient documentation

## 2022-10-10 DIAGNOSIS — C3411 Malignant neoplasm of upper lobe, right bronchus or lung: Secondary | ICD-10-CM | POA: Diagnosis not present

## 2022-10-10 DIAGNOSIS — I700 Atherosclerosis of aorta: Secondary | ICD-10-CM | POA: Diagnosis not present

## 2022-10-10 DIAGNOSIS — Z79899 Other long term (current) drug therapy: Secondary | ICD-10-CM | POA: Insufficient documentation

## 2022-10-10 DIAGNOSIS — F17210 Nicotine dependence, cigarettes, uncomplicated: Secondary | ICD-10-CM | POA: Diagnosis not present

## 2022-10-10 NOTE — Progress Notes (Signed)
[name] is here today for follow up post radiation to the lung.  Lung Side: Right  Does the patient complain of any of the following: Pain:No Shortness of breath w/wo exertion: No Cough: No Hemoptysis: No Pain with swallowing: No Swallowing/choking concerns: No Appetite: Good Energy Level: Good Post radiation skin Changes: No  BP 110/69 (BP Location: Left Arm, Patient Position: Sitting)   Pulse (!) 3   Temp (!) 97.5 F (36.4 C) (Temporal)   Resp 18   Ht 5\' 6"  (1.676 m)   Wt 101 lb (45.8 kg)   SpO2 100%   BMI 16.30 kg/m

## 2022-11-25 ENCOUNTER — Telehealth: Payer: Self-pay | Admitting: Pulmonary Disease

## 2022-11-25 NOTE — Telephone Encounter (Signed)
Pt came in to drop off FMLA Paperwork. I dropped papers in [employer].

## 2022-12-21 ENCOUNTER — Telehealth: Payer: Self-pay | Admitting: Adult Health

## 2022-12-21 NOTE — Telephone Encounter (Signed)
Received a FMLA for on 10/25 from patient's daughter, Alyssa Grove.  I spoke to Ms. Ferree today and found out she needs to be able to take time off to take her mother to medical appointments and anytime she has a flare-up and needs additional assistance.  Daughter was not clear on how often this happens so I put on form 4-16 times in a 12/month period lasting up to 10 hours (she works a 10 hour shift and has Fridays off).  I also explained to her that we can only attest to the treatment of the patient's COPD and if there is additional time off needed for the cancer treatments, she will need to have Dr. Trina Ao office fill out a FMLA form for that.   Sent form to Rubye Oaks, NP for signature.

## 2022-12-27 NOTE — Telephone Encounter (Signed)
Signed FMLA form was faxed to [facility] fax# [phone].  Hard copy mailed to patient.  $29 processing fee added to account

## 2023-01-20 DIAGNOSIS — I700 Atherosclerosis of aorta: Secondary | ICD-10-CM | POA: Diagnosis not present

## 2023-01-20 DIAGNOSIS — M810 Age-related osteoporosis without current pathological fracture: Secondary | ICD-10-CM | POA: Diagnosis not present

## 2023-01-20 DIAGNOSIS — Z79899 Other long term (current) drug therapy: Secondary | ICD-10-CM | POA: Diagnosis not present

## 2023-01-20 DIAGNOSIS — I272 Pulmonary hypertension, unspecified: Secondary | ICD-10-CM | POA: Diagnosis not present

## 2023-01-20 DIAGNOSIS — J439 Emphysema, unspecified: Secondary | ICD-10-CM | POA: Diagnosis not present

## 2023-01-20 DIAGNOSIS — Z8551 Personal history of malignant neoplasm of bladder: Secondary | ICD-10-CM | POA: Diagnosis not present

## 2023-01-20 DIAGNOSIS — Z0000 Encounter for general adult medical examination without abnormal findings: Secondary | ICD-10-CM | POA: Diagnosis not present

## 2023-01-20 DIAGNOSIS — Z136 Encounter for screening for cardiovascular disorders: Secondary | ICD-10-CM | POA: Diagnosis not present

## 2023-01-20 DIAGNOSIS — R0989 Other specified symptoms and signs involving the circulatory and respiratory systems: Secondary | ICD-10-CM | POA: Diagnosis not present

## 2023-01-20 DIAGNOSIS — E46 Unspecified protein-calorie malnutrition: Secondary | ICD-10-CM | POA: Diagnosis not present

## 2023-01-20 DIAGNOSIS — I2510 Atherosclerotic heart disease of native coronary artery without angina pectoris: Secondary | ICD-10-CM | POA: Diagnosis not present

## 2023-01-20 DIAGNOSIS — C3411 Malignant neoplasm of upper lobe, right bronchus or lung: Secondary | ICD-10-CM | POA: Diagnosis not present

## 2023-03-08 ENCOUNTER — Telehealth: Payer: Self-pay | Admitting: Emergency Medicine

## 2023-03-08 NOTE — Telephone Encounter (Signed)
 Contacted [name] to schedule their annual wellness visit. Patient declined to schedule AWV at this time. Transferred care, didn't provide new PCP information.  [facility] Care Guide [facility] AWV TEAM Direct Dial: [phone]

## 2023-03-20 ENCOUNTER — Telehealth: Payer: Self-pay | Admitting: *Deleted

## 2023-03-20 NOTE — Telephone Encounter (Signed)
 CALLED PATIENT'S DAUGHTER - SHARON FERREE TO INFORM OF CT FOR 04-07-23- ARRIVAL TIME- 1:45 PM @ [facility], NO RESTRICTIONS TO SCAN, PATIENT TO RECEIVE RESULTS FROM DR. KINARD ON 04-13-23 @ 4:15 PM, SPOKE WITH PATIENT'S DAUGHTER - SHARON AND SHE IS AWARE OF THESE APPTS. AND THE INSTRUCTIONS

## 2023-04-07 ENCOUNTER — Ambulatory Visit (HOSPITAL_COMMUNITY)
Admission: RE | Admit: 2023-04-07 | Discharge: 2023-04-07 | Disposition: A | Discharge status: Home / Self Care | Payer: Medicare HMO | Source: Ambulatory Visit | Attending: Radiation Oncology | Admitting: Radiation Oncology

## 2023-04-07 DIAGNOSIS — C3411 Malignant neoplasm of upper lobe, right bronchus or lung: Secondary | ICD-10-CM | POA: Diagnosis present

## 2023-04-12 NOTE — Progress Notes (Signed)
 Radiation Oncology         [phone]) [phone] ________________________________  Name: [name] MRN: [id number]  Date: 04/13/2023  DOB: 1945/07/02  Follow-Up Visit Note  CC: Fleet Contras, MD  Josephine Igo, DO  No diagnosis found.  Diagnosis:  The encounter diagnosis was Malignant neoplasm of upper lobe of right lung (HCC).   Squamous cell carcinoma of the right upper lobe , Stage IA1 (cT1a, cN0, cM0)    Cancer Staging  Lung cancer [facility]) Staging form: Lung, AJCC 8th Edition - Clinical stage from 11/04/2021: Stage IA1 (cT1a, cN0, cM0) - Signed by Antony Blackbird, MD on 11/04/2021   Interval Since Last Radiation: 1 year 4 months and 14 days    Intent: Curative  Radiation Treatment Dates: 11/23/2021 through 11/30/2021 Site Technique Total Dose (Gy) Dose per Fx (Gy) Completed Fx Beam Energies  Lung, Right: Lung_R IMRT 54/54 18 3/3 6XFFF      Narrative:  The patient returns today for routine follow-up and to review recent imaging. She was last seen in office on 10-10-22 for a routine follow-up. Since she continued to follow up with Dr. Cristino Martes for management of her chronic conditions.   Most recent CT chest done on 04-07-23 shows  ***    No other significant oncologic interval history since the patient was last seen.               Allergies:  has no known allergies.  Meds: Current Outpatient Medications  Medication Sig Dispense Refill   albuterol (VENTOLIN HFA) 108 (90 Base) MCG/ACT inhaler Inhale 2 puffs into the lungs every 6 (six) hours as needed for wheezing or shortness of breath. 8 g 6   Fluticasone-Umeclidin-Vilant (TRELEGY ELLIPTA) 100-62.5-25 MCG/ACT AEPB Inhale 1 puff into the lungs daily. 60 each 6   ibuprofen (ADVIL) 800 MG tablet Take 800 mg by mouth 2 (two) times daily as needed.     mirtazapine (REMERON) 7.5 MG tablet SMARTSIG:1 Tablet(s) By Mouth Every Evening (Patient not taking: Reported on 04/14/2022)     Multiple Vitamin (MULTIVITAMIN) tablet Take 1  tablet by mouth daily.     pramipexole (MIRAPEX) 0.25 MG tablet Take 0.25 mg by mouth at bedtime. (Patient not taking: Reported on 01/03/2022)     Tiotropium Bromide-Olodaterol (STIOLTO RESPIMAT) 2.5-2.5 MCG/ACT AERS Inhale 2 puffs into the lungs daily. (Patient not taking: Reported on 01/03/2022) 1 each 5   tiZANidine (ZANAFLEX) 2 MG tablet Take by mouth. (Patient not taking: Reported on 01/03/2022)     No current facility-administered medications for this encounter.    Physical Findings: The patient is in no acute distress. Patient is alert and oriented.  vitals were not taken for this visit. .  No significant changes. Lungs are clear to auscultation bilaterally. Heart has regular rate and rhythm. No palpable cervical, supraclavicular, or axillary adenopathy. Abdomen soft, non-tender, normal bowel sounds.   Lab Findings: Lab Results  Component Value Date   WBC 3.0 (L) 10/12/2021   HGB 12.8 10/12/2021   HCT 37.8 10/12/2021   MCV 99.7 10/12/2021   PLT 234 10/12/2021    Radiographic Findings: No results found.  Impression:  Squamous cell carcinoma of the right upper lobe , Stage IA1 (cT1a, cN0, cM0)  The patient is recovering from the effects of radiation.  ***  Plan:  ***   *** minutes of total time was spent for this patient encounter, including preparation, face-to-face counseling with the patient and coordination of care, physical exam, and documentation of  the encounter. ____________________________________  Billie Lade, PhD, MD  This document serves as a record of services personally performed by Antony Blackbird, MD. It was created on his behalf by Herbie Saxon, a trained medical scribe. The creation of this record is based on the scribe's personal observations and the provider's statements to them. This document has been checked and approved by the attending provider.

## 2023-04-13 ENCOUNTER — Encounter: Payer: Self-pay | Admitting: Radiation Oncology

## 2023-04-13 ENCOUNTER — Ambulatory Visit
Admission: RE | Admit: 2023-04-13 | Discharge: 2023-04-13 | Disposition: A | Discharge status: Home / Self Care | Payer: Self-pay | Source: Ambulatory Visit | Attending: Radiation Oncology | Admitting: Radiation Oncology

## 2023-04-13 VITALS — BP 108/67 | HR 72 | Temp 97.9°F | Resp 20 | Ht 67.0 in | Wt 96.4 lb

## 2023-04-13 DIAGNOSIS — I3139 Other pericardial effusion (noninflammatory): Secondary | ICD-10-CM | POA: Insufficient documentation

## 2023-04-13 DIAGNOSIS — I700 Atherosclerosis of aorta: Secondary | ICD-10-CM | POA: Insufficient documentation

## 2023-04-13 DIAGNOSIS — C3411 Malignant neoplasm of upper lobe, right bronchus or lung: Secondary | ICD-10-CM | POA: Diagnosis present

## 2023-04-13 DIAGNOSIS — J432 Centrilobular emphysema: Secondary | ICD-10-CM | POA: Diagnosis not present

## 2023-04-13 DIAGNOSIS — Z79899 Other long term (current) drug therapy: Secondary | ICD-10-CM | POA: Diagnosis not present

## 2023-04-13 DIAGNOSIS — Z923 Personal history of irradiation: Secondary | ICD-10-CM | POA: Diagnosis not present

## 2023-04-13 NOTE — Progress Notes (Signed)
 KELANI ROBART is here today for follow up post radiation to the lung and to receive results from CT scan.  Lung Side: Right, last radiation treatment was 11/20/2021  Does the patient complain of any of the following: Pain:Denies Shortness of breath w/wo exertion: Denies Cough: Denies Hemoptysis: Denies Pain with swallowing: Denies Swallowing/choking concerns: Denies Appetite: Good Energy Level: Fair Post radiation skin Changes: Denies     BP 108/67 (BP Location: Left Arm, Patient Position: Sitting, Cuff Size: Small)   Pulse 72   Temp 97.9 F (36.6 C)   Resp 20   Ht 5\' 7"  (1.702 m)   Wt 96 lb 6.4 oz (43.7 kg)   SpO2 100%   BMI 15.10 kg/m

## 2023-10-09 ENCOUNTER — Telehealth: Payer: Self-pay | Admitting: *Deleted

## 2023-10-09 NOTE — Telephone Encounter (Signed)
 Called patient to inform of Ct for 10-18-23- arrival time- 2:45 pm, no restrictions to scan, patient to receive results from Dr. Shannon on 10-19-23 @ 4 pm, lvm for a return call

## 2023-10-10 ENCOUNTER — Telehealth: Payer: Self-pay | Admitting: *Deleted

## 2023-10-10 NOTE — Telephone Encounter (Signed)
 CALLED PATIENT'S DAUGHTER- SHARON FERREE, TO INFORM THAT CT HAS BEEN MOVED TO 10-20-23- ARRIVAL TIME- 5:15 PM @ [facility], PT. TO CHECK IN @ ED, NO RESTRICTIONS TO SCAN, PATIENT TO RECEIVE RESULTS FROM DR. KINARD ON 10-26-23 @ 3:45 PM, SPOKE WITH PATIENT'S DAUGHTER- SHARON FERREE AND SHE IS AWARE OF THESE APPTS. AND THE INSTRUCTIONS

## 2023-10-18 ENCOUNTER — Ambulatory Visit (HOSPITAL_COMMUNITY)

## 2023-10-19 ENCOUNTER — Ambulatory Visit: Payer: Self-pay | Admitting: Radiation Oncology

## 2023-10-20 ENCOUNTER — Ambulatory Visit (HOSPITAL_COMMUNITY)
Admission: RE | Admit: 2023-10-20 | Discharge: 2023-10-20 | Disposition: A | Discharge status: Home / Self Care | Source: Ambulatory Visit | Attending: Radiology | Admitting: Radiology

## 2023-10-20 DIAGNOSIS — C3411 Malignant neoplasm of upper lobe, right bronchus or lung: Secondary | ICD-10-CM | POA: Insufficient documentation

## 2023-10-25 NOTE — Progress Notes (Signed)
 Radiation Oncology         [phone] ________________________________  Name: [name] MRN: [id number]  Date: 10/26/2023  DOB: August 21, 1945  Follow-Up Visit Note  CC: Shelda Atlas, MD  Brenna Adine CROME, DO  No diagnosis found.  Diagnosis: Squamous cell carcinoma of the right upper lobe , Stage IA1 (cT1a, cN0, cM0); s/p SBRT    Cancer Staging  Lung cancer [facility]) Staging form: Lung, AJCC 8th Edition - Clinical stage from 11/04/2021: Stage IA1 (cT1a, cN0, cM0) - Signed by Shannon Agent, MD on 11/04/2021   Interval Since Last Radiation: 1 year, 10 months, 25 days    Intent: Curative  Radiation Treatment Dates: 11/23/2021 through 11/30/2021 Site Technique Total Dose (Gy) Dose per Fx (Gy) Completed Fx Beam Energies  Lung, Right: Lung_R IMRT 54/54 18 3/3 [id number]      Narrative:  The patient returns today for routine follow-up and to review recent imaging. She was last seen in office on 04/13/23 for a routine follow-up. Patient continued to follow up with their specialists to manage their chronic conditions.   Most recent CT chest performed on 10/20/23 was unchanged treated nodule in the peripheral inferior right upper lobe measuring 0.7 x 0.5 cm with an adjacent fiducial. No evidence of lymphadenopathy or metastatic disease in the chest.                         No other significant oncologic interval history since the patient was last seen.   Allergies:  has no known allergies.  Meds: Current Outpatient Medications  Medication Sig Dispense Refill   albuterol  (VENTOLIN  HFA) 108 (90 Base) MCG/ACT inhaler Inhale 2 puffs into the lungs every 6 (six) hours as needed for wheezing or shortness of breath. 8 g 6   Fluticasone-Umeclidin-Vilant (TRELEGY ELLIPTA ) 100-62.5-25 MCG/ACT AEPB Inhale 1 puff into the lungs daily. 60 each 6   ibuprofen (ADVIL) 800 MG tablet Take 800 mg by mouth 2 (two) times daily as needed.     mirtazapine (REMERON) 7.5 MG tablet SMARTSIG:1 Tablet(s) By Mouth  Every Evening (Patient not taking: Reported on 04/13/2023)     Multiple Vitamin (MULTIVITAMIN) tablet Take 1 tablet by mouth daily. (Patient not taking: Reported on 04/13/2023)     pramipexole (MIRAPEX) 0.25 MG tablet Take 0.25 mg by mouth at bedtime. (Patient not taking: Reported on 04/13/2023)     Tiotropium Bromide-Olodaterol (STIOLTO RESPIMAT ) 2.5-2.5 MCG/ACT AERS Inhale 2 puffs into the lungs daily. (Patient not taking: Reported on 04/13/2023) 1 each 5   tiZANidine (ZANAFLEX) 2 MG tablet Take by mouth. (Patient not taking: Reported on 04/13/2023)     No current facility-administered medications for this visit.    Physical Findings: The patient is in no acute distress. Patient is alert and oriented.  vitals were not taken for this visit. .  No significant changes. Lungs are clear to auscultation bilaterally. Heart has regular rate and rhythm. No palpable cervical, supraclavicular, or axillary adenopathy. Abdomen soft, non-tender, normal bowel sounds.   Lab Findings: Lab Results  Component Value Date   WBC 3.0 (L) 10/12/2021   HGB 12.8 10/12/2021   HCT 37.8 10/12/2021   MCV 99.7 10/12/2021   PLT 234 10/12/2021    Radiographic Findings: CT CHEST WO CONTRAST Result Date: 10/25/2023 CLINICAL DATA:  Non-small-cell lung cancer restaging * Tracking Code: BO * EXAM: CT CHEST WITHOUT CONTRAST TECHNIQUE: Multidetector CT imaging of the chest was performed following the standard protocol without IV contrast. RADIATION  DOSE REDUCTION: This exam was performed according to the departmental dose-optimization program which includes automated exposure control, adjustment of the mA and/or kV according to patient size and/or use of iterative reconstruction technique. COMPARISON:  04/07/2023 FINDINGS: Cardiovascular: Aortic atherosclerosis. Normal heart size. Three-vessel coronary artery calcifications. No pericardial effusion. Mediastinum/Nodes: No enlarged mediastinal, hilar, or axillary lymph nodes. Thyroid  gland, trachea, and esophagus demonstrate no significant findings. Lungs/Pleura: Mild centrilobular and paraseptal emphysema. Unchanged treated nodule in the peripheral inferior right upper lobe measuring 0.7 x 0.5 cm with an adjacent fiducial (series 8, image 76). No pleural effusion or pneumothorax. Upper Abdomen: No acute abnormality. Musculoskeletal: No chest wall abnormality. No acute osseous findings. Dextroscoliosis of the thoracic spine. Extensive bridging osteophytosis throughout the thoracic spine in keeping with advanced DISH. IMPRESSION: 1. Unchanged treated nodule in the peripheral inferior right upper lobe measuring 0.7 x 0.5 cm with an adjacent fiducial. 2. No evidence of lymphadenopathy or metastatic disease in the chest. 3. Emphysema. 4. Coronary artery disease. Aortic Atherosclerosis (ICD10-I70.0) and Emphysema (ICD10-J43.9). Electronically Signed   By: Marolyn JONETTA Jaksch M.D.   On: 10/25/2023 10:03    Impression: Squamous cell carcinoma of the right upper lobe , Stage IA1 (cT1a, cN0, cM0); s/p SBRT  The patient is recovering from the effects of radiation.  ***  Plan:  ***   *** minutes of total time was spent for this patient encounter, including preparation, face-to-face counseling with the patient and coordination of care, physical exam, and documentation of the encounter. ____________________________________  Lynwood CHARM Nasuti, PhD, MD  This document serves as a record of services personally performed by Lynwood Nasuti, MD. It was created on his behalf by Reymundo Cartwright, a trained medical scribe. The creation of this record is based on the scribe's personal observations and the provider's statements to them. This document has been checked and approved by the attending provider.

## 2023-10-26 ENCOUNTER — Ambulatory Visit
Admission: RE | Admit: 2023-10-26 | Discharge: 2023-10-26 | Disposition: A | Discharge status: Home / Self Care | Source: Ambulatory Visit | Attending: Radiation Oncology | Admitting: Radiation Oncology

## 2023-10-26 ENCOUNTER — Encounter: Payer: Self-pay | Admitting: Radiation Oncology

## 2023-10-26 DIAGNOSIS — I2510 Atherosclerotic heart disease of native coronary artery without angina pectoris: Secondary | ICD-10-CM | POA: Diagnosis not present

## 2023-10-26 DIAGNOSIS — J438 Other emphysema: Secondary | ICD-10-CM | POA: Diagnosis not present

## 2023-10-26 DIAGNOSIS — M419 Scoliosis, unspecified: Secondary | ICD-10-CM | POA: Insufficient documentation

## 2023-10-26 DIAGNOSIS — R911 Solitary pulmonary nodule: Secondary | ICD-10-CM | POA: Diagnosis not present

## 2023-10-26 DIAGNOSIS — I700 Atherosclerosis of aorta: Secondary | ICD-10-CM | POA: Diagnosis not present

## 2023-10-26 DIAGNOSIS — Z923 Personal history of irradiation: Secondary | ICD-10-CM | POA: Diagnosis not present

## 2023-10-26 DIAGNOSIS — C3411 Malignant neoplasm of upper lobe, right bronchus or lung: Secondary | ICD-10-CM | POA: Diagnosis present

## 2023-10-26 DIAGNOSIS — Z79899 Other long term (current) drug therapy: Secondary | ICD-10-CM | POA: Diagnosis not present

## 2023-10-26 DIAGNOSIS — J432 Centrilobular emphysema: Secondary | ICD-10-CM | POA: Diagnosis not present

## 2023-10-26 NOTE — Progress Notes (Signed)
 [name] is here today for follow up post radiation to the lung.  Lung Side: Right patient completed treatment on 11/30/21.  Does the patient complain of any of the following: Pain:No Shortness of breath w/wo exertion: no Cough: Yes, productive  Hemoptysis: no Pain with swallowing: no Swallowing/choking concerns: no Appetite: fair Energy Level: good Post radiation skin Changes: No    Additional comments if applicable:  BP (P) 107/61 (BP Location: Left Arm, Patient Position: Sitting)   Pulse (P) 67   Temp (!) (P) 97.5 F (36.4 C) (Temporal)   Resp (P) 18   Ht (P) 5' 6 (1.676 m)   Wt (P) 91 lb 4 oz (41.4 kg)   BMI (P) 14.73 kg/m

## 2024-04-29 ENCOUNTER — Ambulatory Visit: Payer: Self-pay | Admitting: Radiation Oncology
